# Patient Record
Sex: Female | Born: 1990 | Race: Black or African American | Hispanic: Yes | Marital: Married | Smoking: Never smoker
Health system: Southern US, Community
[De-identification: ages and names within clinical notes are randomized; demographics above are authoritative.]

## PROBLEM LIST (undated history)

## (undated) DIAGNOSIS — E049 Nontoxic goiter, unspecified: Secondary | ICD-10-CM

## (undated) DIAGNOSIS — E559 Vitamin D deficiency, unspecified: Secondary | ICD-10-CM

## (undated) DIAGNOSIS — I10 Essential (primary) hypertension: Secondary | ICD-10-CM

## (undated) DIAGNOSIS — S82853A Displaced trimalleolar fracture of unspecified lower leg, initial encounter for closed fracture: Secondary | ICD-10-CM

## (undated) HISTORY — DX: Nontoxic goiter, unspecified: E04.9

## (undated) HISTORY — DX: Vitamin D deficiency, unspecified: E55.9

## (undated) HISTORY — PX: GASTRECTOMY: SHX58

## (undated) HISTORY — PX: CHOLECYSTECTOMY: SHX55

---

## 2017-11-26 ENCOUNTER — Ambulatory Visit (INDEPENDENT_AMBULATORY_CARE_PROVIDER_SITE_OTHER): Payer: Self-pay | Admitting: Orthopaedic Surgery

## 2017-11-26 ENCOUNTER — Encounter (HOSPITAL_BASED_OUTPATIENT_CLINIC_OR_DEPARTMENT_OTHER): Payer: Self-pay | Admitting: *Deleted

## 2017-11-26 ENCOUNTER — Other Ambulatory Visit: Payer: Self-pay

## 2017-11-26 ENCOUNTER — Ambulatory Visit (INDEPENDENT_AMBULATORY_CARE_PROVIDER_SITE_OTHER): Payer: Self-pay

## 2017-11-26 DIAGNOSIS — S82852A Displaced trimalleolar fracture of left lower leg, initial encounter for closed fracture: Secondary | ICD-10-CM

## 2017-11-26 NOTE — Progress Notes (Signed)
Spoke with Sherri at Dr. Warren DanesXu's office, explained pt could not be done here due to [facility] guidelines with her BMI 59.91.

## 2017-11-26 NOTE — Progress Notes (Signed)
Office Visit Note   Patient: Stacey Robinson           Date of Birth: Dec 07, 1990           MRN: [id number] Visit Date: 11/26/2017              Requested by: No referring provider defined for this encounter. PCP: Patient, No Pcp Per   Assessment & Plan: Visit Diagnoses:  1. Closed displaced trimalleolar fracture of left ankle, initial encounter     Plan: Impression is unstable trimalleolar ankle fracture with Weber C lateral malleolus.  X-rays were reviewed with the patient and recommendation is for surgical fixation.  We discussed the risks and benefits and expected postoperative recovery.  She understands wished to proceed.  We will need to wait a week to let the swelling go down first.  For the meantime we will put her in a short leg splint.  She is to keep it elevated at all times and to ice at all times.  Follow-Up Instructions: Return for 2 week postop visit.   Orders:  Orders Placed This Encounter  Procedures  . XR Ankle Complete Left   No orders of the defined types were placed in this encounter.     Procedures: No procedures performed   Clinical Data: No additional findings.   Subjective: Chief Complaint  Patient presents with  . Left Ankle - Pain, Injury    Larey SeatFell 11/24/17    Stacey Robinson is a 27 year old healthy female who comes in with acute injury to her left ankle that she sustained 2 days ago while she was at her son's track meet.  She stepped awkwardly and fell and twisted her ankle underneath her body.  She denies any numbness tingling.  She was evaluated initially at the ED down in [place].   Review of Systems  Constitutional: Negative.   HENT: Negative.   Eyes: Negative.   Respiratory: Negative.   Cardiovascular: Negative.   Endocrine: Negative.   Musculoskeletal: Negative.   Neurological: Negative.   Hematological: Negative.   Psychiatric/Behavioral: Negative.   All other systems reviewed and are negative.    Objective: Vital Signs:  There were no vitals taken for this visit.  Physical Exam  Constitutional: She is oriented to person, place, and time. She appears well-developed and well-nourished.  HENT:  Head: Normocephalic and atraumatic.  Eyes: EOM are normal.  Neck: Neck supple.  Pulmonary/Chest: Effort normal.  Abdominal: Soft.  Neurological: She is alert and oriented to person, place, and time.  Skin: Skin is warm. Capillary refill takes less than 2 seconds.  Psychiatric: She has a normal mood and affect. Her behavior is normal. Judgment and thought content normal.  Nursing note and vitals reviewed.   Ortho Exam Left ankle exam shows severe swelling without any fracture blisters.  No neurovascular compromise. Specialty Comments:  No specialty comments available.  Imaging: Xr Ankle Complete Left  Result Date: 11/26/2017 Displaced Weber C lateral malleolus fracture with widening of the medial clear space and a small nondisplaced posterior malleolus fracture.    PMFS History: There are no active problems to display for this patient.  No past medical history on file.  No family history on file.   Social History   Occupational History  . Not on file  Tobacco Use  . Smoking status: Not on file  Substance and Sexual Activity  . Alcohol use: Not on file  . Drug use: Not on file  . Sexual activity: Not on file

## 2017-11-27 NOTE — Pre-Procedure Instructions (Signed)
Stacey Robinson  11/27/2017      [facility] [phone] [place] - [address] [facility] [address] [facility] [address] [place] [id number] Phone: [phone] Fax: [phone]    Your procedure is scheduled on Mon., December 02, 2017 from 4:00PM-5:38PM  [facility]" at 2:00PM  Call this number if you have problems the morning of surgery:  [phone]   Remember:  Do not eat or drink after midnight on June 23rd    Take these medicines the morning of surgery with A SIP OF WATER: If needed HYDROcodone-acetaminophen (NORCO/VICODIN)  As of today, stop taking all Other Aspirin Products, Vitamins, Fish oils, and Herbal medications. Also stop all NSAIDS i.e. Advil, Ibuprofen, Motrin, Aleve, Anaprox, Naproxen, BC, Goody Powders, and all Supplements.    Do not wear jewelry, make-up or nail polish.  Do not wear lotions, powders, or perfumes, or deodorant.  Do not shave 48 hours prior to surgery.    Do not bring valuables to the hospital.  [facility] is not responsible for any belongings or valuables.  Contacts, dentures or bridgework may not be worn into surgery.  Leave your suitcase in the car.  After surgery it may be brought to your room.  For patients admitted to the hospital, discharge time will be determined by your treatment team.  Patients discharged the day of surgery will not be allowed to drive home.   Special instructions:   [place]- Preparing For Surgery  Before surgery, you can play an important role. Because skin is not sterile, your skin needs to be as free of germs as possible. You can reduce the number of germs on your skin by washing with CHG (chlorahexidine gluconate) Soap before surgery.  CHG is an antiseptic cleaner which kills germs and bonds with the skin to continue killing germs even after washing.    Oral Hygiene is also important to reduce your risk of infection.  Remember - BRUSH  YOUR TEETH THE MORNING OF SURGERY WITH YOUR REGULAR TOOTHPASTE  Please do not use if you have an allergy to CHG or antibacterial soaps. If your skin becomes reddened/irritated stop using the CHG.  Do not shave (including legs and underarms) for at least 48 hours prior to first CHG shower. It is OK to shave your face.  Please follow these instructions carefully.   1. Shower the NIGHT BEFORE SURGERY and the MORNING OF SURGERY with CHG.   2. If you chose to wash your hair, wash your hair first as usual with your normal shampoo.  3. After you shampoo, rinse your hair and body thoroughly to remove the shampoo.  4. Use CHG as you would any other liquid soap. You can apply CHG directly to the skin and wash gently with a scrungie or a clean washcloth.   5. Apply the CHG Soap to your body ONLY FROM THE NECK DOWN.  Do not use on open wounds or open sores. Avoid contact with your eyes, ears, mouth and genitals (private parts). Wash Face and genitals (private parts)  with your normal soap.  6. Wash thoroughly, paying special attention to the area where your surgery will be performed.  7. Thoroughly rinse your body with warm water from the neck down.  8. DO NOT shower/wash with your normal soap after using and rinsing off the CHG Soap.  9. Pat yourself dry with a CLEAN TOWEL.  10.  Wear CLEAN PAJAMAS to bed the night before surgery, wear comfortable clothes the morning of surgery  11. Place CLEAN SHEETS on your bed the night of your first shower and DO NOT SLEEP WITH PETS.  Day of Surgery:  Do not apply any deodorants/lotions.  Please wear clean clothes to the hospital/surgery center.   Remember to brush your teeth WITH YOUR REGULAR TOOTHPASTE.  Please read over the following fact sheets that you were given. Pain Booklet, Coughing and Deep Breathing, MRSA Information and Surgical Site Infection Prevention

## 2017-11-28 ENCOUNTER — Telehealth (INDEPENDENT_AMBULATORY_CARE_PROVIDER_SITE_OTHER): Payer: Self-pay

## 2017-11-28 ENCOUNTER — Other Ambulatory Visit (INDEPENDENT_AMBULATORY_CARE_PROVIDER_SITE_OTHER): Payer: Self-pay | Admitting: Physician Assistant

## 2017-11-28 ENCOUNTER — Other Ambulatory Visit: Payer: Self-pay

## 2017-11-28 ENCOUNTER — Encounter (HOSPITAL_COMMUNITY)
Admission: RE | Admit: 2017-11-28 | Discharge: 2017-11-28 | Disposition: A | Discharge status: Home / Self Care | Payer: Self-pay | Source: Ambulatory Visit | Attending: Orthopaedic Surgery | Admitting: Orthopaedic Surgery

## 2017-11-28 ENCOUNTER — Encounter (HOSPITAL_COMMUNITY): Payer: Self-pay

## 2017-11-28 DIAGNOSIS — Z01812 Encounter for preprocedural laboratory examination: Secondary | ICD-10-CM | POA: Insufficient documentation

## 2017-11-28 HISTORY — DX: Displaced trimalleolar fracture of unspecified lower leg, initial encounter for closed fracture: S82.853A

## 2017-11-28 LAB — TYPE AND SCREEN
ABO/RH(D): O POS
Antibody Screen: NEGATIVE

## 2017-11-28 LAB — ABO/RH: ABO/RH(D): O POS

## 2017-11-28 LAB — BASIC METABOLIC PANEL
ANION GAP: 7 (ref 5–15)
BUN: 7 mg/dL (ref 6–20)
CHLORIDE: 109 mmol/L (ref 101–111)
CO2: 25 mmol/L (ref 22–32)
Calcium: 8.9 mg/dL (ref 8.9–10.3)
Creatinine, Ser: 0.7 mg/dL (ref 0.44–1.00)
Glucose, Bld: 88 mg/dL (ref 65–99)
POTASSIUM: 3.2 mmol/L — AB (ref 3.5–5.1)
SODIUM: 141 mmol/L (ref 135–145)

## 2017-11-28 LAB — CBC
HEMATOCRIT: 42.4 % (ref 36.0–46.0)
Hemoglobin: 13.3 g/dL (ref 12.0–15.0)
MCH: 29 pg (ref 26.0–34.0)
MCHC: 31.4 g/dL (ref 30.0–36.0)
MCV: 92.4 fL (ref 78.0–100.0)
Platelets: 265 10*3/uL (ref 150–400)
RBC: 4.59 MIL/uL (ref 3.87–5.11)
RDW: 13.5 % (ref 11.5–15.5)
WBC: 7.5 10*3/uL (ref 4.0–10.5)

## 2017-11-28 LAB — SURGICAL PCR SCREEN
MRSA, PCR: NEGATIVE
STAPHYLOCOCCUS AUREUS: POSITIVE — AB

## 2017-11-28 LAB — HCG, SERUM, QUALITATIVE: Preg, Serum: NEGATIVE

## 2017-11-28 MED ORDER — POTASSIUM CHLORIDE CRYS ER 10 MEQ PO TBCR: 30.0000 meq | EXTENDED_RELEASE_TABLET | Freq: Two times a day (BID) | ORAL | 0 refills | Status: DC

## 2017-11-28 NOTE — Progress Notes (Signed)
Left vm to return my call.  If you talk to her, you can tell her that her potassium is 3.2 which is low.  Normal is 3.5-5.1

## 2017-11-28 NOTE — Progress Notes (Signed)
PCP - Denies  Cardiologist - Denies  Chest x-ray - Denies  EKG - Denies  Stress Test - Denies  ECHO - Denies  Cardiac Cath - Denies  Sleep Study - Denies CPAP - None  LABS- 11/28/17: CBC, BMP, Preg, T/S  ASA- Denies   Anesthesia- No  Pt denies having chest pain, sob, or fever at this time. All instructions explained to the pt, with a verbal understanding of the material. Pt agrees to go over the instructions while at home for a better understanding. The opportunity to ask questions was provided.

## 2017-11-28 NOTE — Progress Notes (Signed)
Will you call patient and let her know that her potassium is low and that I have called in some medicine to take for this?

## 2017-11-28 NOTE — Telephone Encounter (Signed)
error 

## 2017-12-02 ENCOUNTER — Encounter (HOSPITAL_COMMUNITY): Payer: Self-pay

## 2017-12-02 ENCOUNTER — Ambulatory Visit (HOSPITAL_COMMUNITY)
Admission: RE | Admit: 2017-12-02 | Discharge: 2017-12-02 | Disposition: A | Discharge status: Home / Self Care | Payer: Self-pay | Source: Ambulatory Visit | Attending: Orthopaedic Surgery | Admitting: Orthopaedic Surgery

## 2017-12-02 ENCOUNTER — Ambulatory Visit (HOSPITAL_COMMUNITY): Payer: Self-pay

## 2017-12-02 ENCOUNTER — Ambulatory Visit (HOSPITAL_COMMUNITY): Payer: Self-pay | Admitting: Certified Registered"

## 2017-12-02 ENCOUNTER — Encounter (HOSPITAL_COMMUNITY)
Admission: RE | Disposition: A | Discharge status: Home / Self Care | Payer: Self-pay | Source: Ambulatory Visit | Attending: Orthopaedic Surgery

## 2017-12-02 DIAGNOSIS — X58XXXA Exposure to other specified factors, initial encounter: Secondary | ICD-10-CM | POA: Insufficient documentation

## 2017-12-02 DIAGNOSIS — Z880 Allergy status to penicillin: Secondary | ICD-10-CM | POA: Insufficient documentation

## 2017-12-02 DIAGNOSIS — Z6841 Body Mass Index (BMI) 40.0 and over, adult: Secondary | ICD-10-CM | POA: Insufficient documentation

## 2017-12-02 DIAGNOSIS — S82852A Displaced trimalleolar fracture of left lower leg, initial encounter for closed fracture: Secondary | ICD-10-CM

## 2017-12-02 DIAGNOSIS — S93432A Sprain of tibiofibular ligament of left ankle, initial encounter: Secondary | ICD-10-CM | POA: Insufficient documentation

## 2017-12-02 DIAGNOSIS — Z79899 Other long term (current) drug therapy: Secondary | ICD-10-CM | POA: Insufficient documentation

## 2017-12-02 DIAGNOSIS — Z539 Procedure and treatment not carried out, unspecified reason: Secondary | ICD-10-CM

## 2017-12-02 HISTORY — PX: ORIF ANKLE FRACTURE: SHX5408

## 2017-12-02 SURGERY — OPEN REDUCTION INTERNAL FIXATION (ORIF) ANKLE FRACTURE
Anesthesia: General | Site: Ankle | Laterality: Left

## 2017-12-02 MED ORDER — FENTANYL CITRATE (PF) 250 MCG/5ML IJ SOLN
INTRAMUSCULAR | Status: AC
  Filled 2017-12-02: qty 5

## 2017-12-02 MED ORDER — MIDAZOLAM HCL 2 MG/2ML IJ SOLN
2.0000 mg | Freq: Once | INTRAMUSCULAR | Status: AC
  Administered 2017-12-02: 2 mg via INTRAVENOUS

## 2017-12-02 MED ORDER — ACETAMINOPHEN 10 MG/ML IV SOLN
INTRAVENOUS | Status: DC | PRN
  Administered 2017-12-02: 1000 mg via INTRAVENOUS

## 2017-12-02 MED ORDER — LIDOCAINE 2% (20 MG/ML) 5 ML SYRINGE
INTRAMUSCULAR | Status: DC | PRN
  Administered 2017-12-02: 60 mg via INTRAVENOUS

## 2017-12-02 MED ORDER — ROPIVACAINE HCL 7.5 MG/ML IJ SOLN
INTRAMUSCULAR | Status: DC | PRN
  Administered 2017-12-02: 30 mL via PERINEURAL

## 2017-12-02 MED ORDER — DEXMEDETOMIDINE HCL IN NACL 200 MCG/50ML IV SOLN
INTRAVENOUS | Status: DC | PRN
  Administered 2017-12-02: 4 ug via INTRAVENOUS
  Administered 2017-12-02: 8 ug via INTRAVENOUS
  Administered 2017-12-02: 4 ug via INTRAVENOUS
  Administered 2017-12-02: 12 ug via INTRAVENOUS
  Administered 2017-12-02: 4 ug via INTRAVENOUS

## 2017-12-02 MED ORDER — LIDOCAINE 2% (20 MG/ML) 5 ML SYRINGE
INTRAMUSCULAR | Status: AC
  Filled 2017-12-02: qty 5

## 2017-12-02 MED ORDER — FENTANYL CITRATE (PF) 100 MCG/2ML IJ SOLN
INTRAMUSCULAR | Status: AC
  Filled 2017-12-02: qty 2

## 2017-12-02 MED ORDER — DEXAMETHASONE SODIUM PHOSPHATE 10 MG/ML IJ SOLN
INTRAMUSCULAR | Status: AC
  Filled 2017-12-02: qty 1

## 2017-12-02 MED ORDER — CHLORHEXIDINE GLUCONATE 4 % EX LIQD: 60.0000 mL | Freq: Once | CUTANEOUS | Status: DC

## 2017-12-02 MED ORDER — OXYCODONE HCL 5 MG PO TABS
ORAL_TABLET | ORAL | Status: AC
  Filled 2017-12-02: qty 1

## 2017-12-02 MED ORDER — BUPIVACAINE-EPINEPHRINE (PF) 0.5% -1:200000 IJ SOLN
INTRAMUSCULAR | Status: DC | PRN
  Administered 2017-12-02: 30 mL via PERINEURAL
  Administered 2017-12-02: 25 mL via PERINEURAL

## 2017-12-02 MED ORDER — VANCOMYCIN HCL 1000 MG IV SOLR
INTRAVENOUS | Status: AC
  Filled 2017-12-02: qty 1000

## 2017-12-02 MED ORDER — OXYCODONE HCL 5 MG/5ML PO SOLN: 5.0000 mg | Freq: Once | ORAL | Status: AC | PRN

## 2017-12-02 MED ORDER — KETOROLAC TROMETHAMINE 30 MG/ML IJ SOLN
INTRAMUSCULAR | Status: DC | PRN
  Administered 2017-12-02: 30 mg via INTRAVENOUS

## 2017-12-02 MED ORDER — OXYCODONE HCL 5 MG PO TABS
5.0000 mg | ORAL_TABLET | Freq: Once | ORAL | Status: AC
Start: 2017-12-02 — End: 2017-12-02
  Administered 2017-12-02: 5 mg via ORAL

## 2017-12-02 MED ORDER — ASPIRIN EC 81 MG PO TBEC: 81.0000 mg | DELAYED_RELEASE_TABLET | Freq: Two times a day (BID) | ORAL | 0 refills | Status: DC

## 2017-12-02 MED ORDER — ONDANSETRON HCL 4 MG/2ML IJ SOLN
INTRAMUSCULAR | Status: AC
  Filled 2017-12-02: qty 2

## 2017-12-02 MED ORDER — SODIUM CHLORIDE 0.9 % IR SOLN
Status: DC | PRN
  Administered 2017-12-02: 3000 mL

## 2017-12-02 MED ORDER — ROCURONIUM BROMIDE 10 MG/ML (PF) SYRINGE
PREFILLED_SYRINGE | INTRAVENOUS | Status: DC | PRN
  Administered 2017-12-02: 50 mg via INTRAVENOUS

## 2017-12-02 MED ORDER — VANCOMYCIN HCL 1000 MG IV SOLR
INTRAVENOUS | Status: DC | PRN
  Administered 2017-12-02: 1000 mg

## 2017-12-02 MED ORDER — CLINDAMYCIN PHOSPHATE 900 MG/50ML IV SOLN
INTRAVENOUS | Status: AC
  Filled 2017-12-02: qty 50

## 2017-12-02 MED ORDER — BUPIVACAINE-EPINEPHRINE (PF) 0.25% -1:200000 IJ SOLN
INTRAMUSCULAR | Status: AC
  Filled 2017-12-02: qty 30

## 2017-12-02 MED ORDER — SENNOSIDES-DOCUSATE SODIUM 8.6-50 MG PO TABS: 1.0000 | ORAL_TABLET | Freq: Every evening | ORAL | 1 refills | Status: DC | PRN

## 2017-12-02 MED ORDER — PROPOFOL 10 MG/ML IV BOLUS
INTRAVENOUS | Status: DC | PRN
  Administered 2017-12-02: 250 mg via INTRAVENOUS
  Administered 2017-12-02: 30 mg via INTRAVENOUS
  Administered 2017-12-02: 50 mg via INTRAVENOUS

## 2017-12-02 MED ORDER — OXYCODONE HCL 5 MG PO TABS
5.0000 mg | ORAL_TABLET | Freq: Once | ORAL | Status: AC | PRN
  Administered 2017-12-02: 5 mg via ORAL

## 2017-12-02 MED ORDER — PROPOFOL 10 MG/ML IV BOLUS
INTRAVENOUS | Status: AC
  Filled 2017-12-02: qty 40

## 2017-12-02 MED ORDER — FENTANYL CITRATE (PF) 100 MCG/2ML IJ SOLN
25.0000 ug | INTRAMUSCULAR | Status: DC | PRN
  Administered 2017-12-02: 50 ug via INTRAVENOUS
  Administered 2017-12-02: 25 ug via INTRAVENOUS

## 2017-12-02 MED ORDER — OXYCODONE HCL ER 10 MG PO T12A: 10.0000 mg | EXTENDED_RELEASE_TABLET | Freq: Two times a day (BID) | ORAL | 0 refills | Status: AC

## 2017-12-02 MED ORDER — BUPIVACAINE-EPINEPHRINE 0.25% -1:200000 IJ SOLN
INTRAMUSCULAR | Status: DC | PRN
  Administered 2017-12-02: 30 mL

## 2017-12-02 MED ORDER — MIDAZOLAM HCL 2 MG/2ML IJ SOLN
INTRAMUSCULAR | Status: AC
  Filled 2017-12-02: qty 2

## 2017-12-02 MED ORDER — PROMETHAZINE HCL 25 MG PO TABS: 25.0000 mg | ORAL_TABLET | Freq: Four times a day (QID) | ORAL | 1 refills | Status: DC | PRN

## 2017-12-02 MED ORDER — METHOCARBAMOL 750 MG PO TABS: 750.0000 mg | ORAL_TABLET | Freq: Two times a day (BID) | ORAL | 0 refills | Status: DC | PRN

## 2017-12-02 MED ORDER — ACETAMINOPHEN 10 MG/ML IV SOLN
INTRAVENOUS | Status: AC
  Filled 2017-12-02: qty 100

## 2017-12-02 MED ORDER — OXYCODONE-ACETAMINOPHEN 5-325 MG PO TABS: 1.0000 | ORAL_TABLET | ORAL | 0 refills | Status: DC | PRN

## 2017-12-02 MED ORDER — LACTATED RINGERS IV SOLN
INTRAVENOUS | Status: DC
  Administered 2017-12-02 (×2): via INTRAVENOUS

## 2017-12-02 MED ORDER — BUPIVACAINE HCL (PF) 0.25 % IJ SOLN
INTRAMUSCULAR | Status: AC
  Filled 2017-12-02: qty 30

## 2017-12-02 MED ORDER — ZINC SULFATE 220 (50 ZN) MG PO CAPS: 220.0000 mg | ORAL_CAPSULE | Freq: Every day | ORAL | 0 refills | Status: DC

## 2017-12-02 MED ORDER — DEXAMETHASONE SODIUM PHOSPHATE 10 MG/ML IJ SOLN
INTRAMUSCULAR | Status: DC | PRN
  Administered 2017-12-02: 10 mg via INTRAVENOUS

## 2017-12-02 MED ORDER — SUGAMMADEX SODIUM 500 MG/5ML IV SOLN
INTRAVENOUS | Status: AC
  Filled 2017-12-02: qty 5

## 2017-12-02 MED ORDER — PROMETHAZINE HCL 25 MG/ML IJ SOLN: 6.2500 mg | INTRAMUSCULAR | Status: DC | PRN

## 2017-12-02 MED ORDER — FENTANYL CITRATE (PF) 250 MCG/5ML IJ SOLN
INTRAMUSCULAR | Status: DC | PRN
  Administered 2017-12-02: 50 ug via INTRAVENOUS
  Administered 2017-12-02 (×2): 100 ug via INTRAVENOUS
  Administered 2017-12-02 (×2): 50 ug via INTRAVENOUS
  Administered 2017-12-02: 100 ug via INTRAVENOUS
  Administered 2017-12-02 (×2): 50 ug via INTRAVENOUS
  Administered 2017-12-02: 100 ug via INTRAVENOUS

## 2017-12-02 MED ORDER — ONDANSETRON HCL 4 MG PO TABS: 4.0000 mg | ORAL_TABLET | Freq: Three times a day (TID) | ORAL | 0 refills | Status: DC | PRN

## 2017-12-02 MED ORDER — FENTANYL CITRATE (PF) 100 MCG/2ML IJ SOLN
100.0000 ug | Freq: Once | INTRAMUSCULAR | Status: AC
  Administered 2017-12-02: 100 ug via INTRAVENOUS

## 2017-12-02 MED ORDER — SUGAMMADEX SODIUM 500 MG/5ML IV SOLN
INTRAVENOUS | Status: DC | PRN
  Administered 2017-12-02: 330 mg via INTRAVENOUS

## 2017-12-02 MED ORDER — CALCIUM CARBONATE-VITAMIN D 500-200 MG-UNIT PO TABS: 1.0000 | ORAL_TABLET | Freq: Three times a day (TID) | ORAL | 12 refills | Status: DC

## 2017-12-02 MED ORDER — CLINDAMYCIN PHOSPHATE 900 MG/50ML IV SOLN
900.0000 mg | INTRAVENOUS | Status: AC
  Administered 2017-12-02: 900 mg via INTRAVENOUS

## 2017-12-02 MED ORDER — MIDAZOLAM HCL 5 MG/5ML IJ SOLN
INTRAMUSCULAR | Status: DC | PRN
  Administered 2017-12-02: 2 mg via INTRAVENOUS

## 2017-12-02 SURGICAL SUPPLY — 61 items
BANDAGE ACE 4X5 VEL STRL LF (GAUZE/BANDAGES/DRESSINGS) IMPLANT
BANDAGE ACE 6X5 VEL STRL LF (GAUZE/BANDAGES/DRESSINGS) ×3 IMPLANT
BANDAGE ELASTIC 6 VELCRO ST LF (GAUZE/BANDAGES/DRESSINGS) ×3 IMPLANT
BANDAGE ESMARK 6X9 LF (GAUZE/BANDAGES/DRESSINGS) ×1 IMPLANT
BIT DRILL QC 2.5MM SHRT EVO SM (DRILL) ×1 IMPLANT
BLADE SURG 15 STRL LF DISP TIS (BLADE) ×1 IMPLANT
BLADE SURG 15 STRL SS (BLADE) ×2
BNDG COHESIVE 4X5 TAN STRL (GAUZE/BANDAGES/DRESSINGS) ×3 IMPLANT
BNDG ESMARK 6X9 LF (GAUZE/BANDAGES/DRESSINGS) ×3
CANISTER SUCT 3000ML PPV (MISCELLANEOUS) ×3 IMPLANT
COVER SURGICAL LIGHT HANDLE (MISCELLANEOUS) ×3 IMPLANT
CUFF TOURNIQUET SINGLE 34IN LL (TOURNIQUET CUFF) IMPLANT
CUFF TOURNIQUET SINGLE 44IN (TOURNIQUET CUFF) ×3 IMPLANT
DRAPE C-ARM 42X72 X-RAY (DRAPES) ×3 IMPLANT
DRAPE C-ARMOR (DRAPES) ×3 IMPLANT
DRAPE INCISE IOBAN 66X45 STRL (DRAPES) ×3 IMPLANT
DRAPE U-SHAPE 47X51 STRL (DRAPES) ×3 IMPLANT
DRILL QC 2.5MM SHORT EVOS SM (DRILL) ×3
DURAPREP 26ML APPLICATOR (WOUND CARE) ×3 IMPLANT
ELECT CAUTERY BLADE 6.4 (BLADE) ×3 IMPLANT
ELECT REM PT RETURN 9FT ADLT (ELECTROSURGICAL) ×3
ELECTRODE REM PT RTRN 9FT ADLT (ELECTROSURGICAL) ×1 IMPLANT
GAUZE SPONGE 4X4 12PLY STRL (GAUZE/BANDAGES/DRESSINGS) ×3 IMPLANT
GAUZE SPONGE 4X4 12PLY STRL LF (GAUZE/BANDAGES/DRESSINGS) ×6 IMPLANT
GAUZE XEROFORM 5X9 LF (GAUZE/BANDAGES/DRESSINGS) ×3 IMPLANT
GLOVE BIOGEL PI IND STRL 7.0 (GLOVE) ×1 IMPLANT
GLOVE BIOGEL PI INDICATOR 7.0 (GLOVE) ×2
GLOVE ECLIPSE 7.0 STRL STRAW (GLOVE) ×3 IMPLANT
GLOVE SKINSENSE NS SZ7.5 (GLOVE) ×2
GLOVE SKINSENSE STRL SZ7.5 (GLOVE) ×1 IMPLANT
GLOVE SURG SYN 7.5  E (GLOVE) ×4
GLOVE SURG SYN 7.5 E (GLOVE) ×2 IMPLANT
GOWN STRL REIN XL XLG (GOWN DISPOSABLE) ×3 IMPLANT
KIT BASIN OR (CUSTOM PROCEDURE TRAY) ×3 IMPLANT
KIT INVISIKNOT ANKLE FRACTURE (Screw) ×6 IMPLANT
KIT TURNOVER KIT B (KITS) ×3 IMPLANT
NEEDLE HYPO 25GX1X1/2 BEV (NEEDLE) IMPLANT
NS IRRIG 1000ML POUR BTL (IV SOLUTION) ×3 IMPLANT
PACK ORTHO EXTREMITY (CUSTOM PROCEDURE TRAY) ×3 IMPLANT
PAD ABD 8X10 STRL (GAUZE/BANDAGES/DRESSINGS) ×9 IMPLANT
PAD ARMBOARD 7.5X6 YLW CONV (MISCELLANEOUS) ×6 IMPLANT
PAD CAST 4YDX4 CTTN HI CHSV (CAST SUPPLIES) ×2 IMPLANT
PADDING CAST COTTON 4X4 STRL (CAST SUPPLIES) ×4
PADDING CAST COTTON 6X4 STRL (CAST SUPPLIES) ×18 IMPLANT
PLATE LOCK EVOS 3.5X94 8H (Plate) ×3 IMPLANT
SCREW CORT 3.5X14 ST EVOS (Screw) ×3 IMPLANT
SCREW CORT EVOS ST 3.5X12 (Screw) ×18 IMPLANT
SPLINT FIBERGLASS 4X30 (CAST SUPPLIES) ×3 IMPLANT
SUCTION FRAZIER HANDLE 10FR (MISCELLANEOUS) ×2
SUCTION TUBE FRAZIER 10FR DISP (MISCELLANEOUS) ×1 IMPLANT
SUT ETHILON 3 0 PS 1 (SUTURE) ×18 IMPLANT
SUT VIC AB 0 CT1 27 (SUTURE) ×4
SUT VIC AB 0 CT1 27XBRD ANBCTR (SUTURE) ×2 IMPLANT
SUT VIC AB 2-0 CT1 27 (SUTURE) ×4
SUT VIC AB 2-0 CT1 TAPERPNT 27 (SUTURE) ×2 IMPLANT
SYR CONTROL 10ML LL (SYRINGE) IMPLANT
TOWEL OR 17X24 6PK STRL BLUE (TOWEL DISPOSABLE) ×3 IMPLANT
TOWEL OR 17X26 10 PK STRL BLUE (TOWEL DISPOSABLE) ×6 IMPLANT
TUBE CONNECTING 12'X1/4 (SUCTIONS) ×1
TUBE CONNECTING 12X1/4 (SUCTIONS) ×2 IMPLANT
UNDERPAD 30X30 (UNDERPADS AND DIAPERS) ×3 IMPLANT

## 2017-12-02 NOTE — Op Note (Addendum)
Date of Surgery: 12/02/2017  INDICATIONS: [name] is a 27 y.o.-year-old female who sustained a left ankle fracture; she was indicated for open reduction and internal fixation due to the displaced nature of the articular fracture and came to the operating room today for this procedure. The patient did consent to the procedure after discussion of the risks and benefits.  PREOPERATIVE DIAGNOSIS:  1. Left trimalleolar ankle fracture 2. Left ankle syndesmosis rupture  POSTOPERATIVE DIAGNOSIS: Same.  PROCEDURE:  1.  Open reduction internal fixation of left trimalleolar ankle fracture without fixation of posterior malleolus 2.  Open reduction tight rope fixation of syndesmosis rupture  SURGEON: N. Glee ArvinMichael Panayiotis [place], M.D.  ASSIST: Starlyn SkeansMary Lindsey Oak[place], [place]; necessary for the timely completion of procedure and due to complexity of procedure.  ANESTHESIA:  general  TOURNIQUET TIME: See anesthesia record  IV FLUIDS AND URINE: See anesthesia.  ESTIMATED BLOOD LOSS: 150 mL.  IMPLANTS: [employer] semitubular locking plate, [employer] tight rope  COMPLICATIONS: None.  DESCRIPTION OF PROCEDURE: The patient was brought to the operating room and placed supine on the operating table.  The patient had been signed prior to the procedure and this was documented. The patient had the anesthesia placed by the anesthesiologist.  A nonsterile tourniquet was placed on the upper thigh.  The prep verification and incision time-outs were performed to confirm that this was the correct patient, site, side and location. The patient had an SCD on the opposite lower extremity. The patient did receive antibiotics prior to the incision and was re-dosed during the procedure as needed at indicated intervals.  The patient had the lower extremity prepped and draped in the standard surgical fashion.  The extremity was exsanguinated using an esmarch bandage and the tourniquet was inflated to 300 mm Hg.  An incision  was made over the lateral aspect of the fibula centered over the level of the fracture.  Dissection was carried through the soft tissue.  Fascia was incised in line with the incision.  Subperiosteal elevation was performed.  The fracture was exposed.  Entrapped periosteum and organized hematoma were removed from the fracture site.  The fracture was then reduced and brought out to length and clamped.  This was confirmed under fluoroscopy.  Once this was done we still noticed that there was some asymmetry of the medial side of the ankle.  I made a separate incision over the medial aspect of the ankle.  Dissection was carried down to the medial malleolus.  There was no evidence of a fracture however the deltoid ligament was avulsed off of the medial malleolus.  This was also entrapped within the medial gutter.  I used my finger to sweep this out of the medial gutter in order to achieve a concentric reduction of ankle.  After this was done I then placed a semitubular plate on the lateral aspect of the fibula.  Nonlocking screws were placed through the proximal portion of the plate into the fibula each with excellent purchase.  I then placed a screw through the plate across the fracture and a leg fashion.  I then placed to tight rope across the syndesmosis parallel to the joint using fluoroscopic guidance with the ankle joint reduced.  Final x-rays were taken.  One gram of vancomycin powder was placed in the wounds.  The surgical wounds were thoroughly irrigated and closed in layered fashion using 0 Vicryl, 2-0 Vicryl, 3-0 nylon.  Sterile dressings were applied.  Foot was immobilized in a  short leg splint at 90 degrees.  Patient tolerated procedure well had no immediate complications.  POSTOPERATIVE PLAN: Stacey Robinson will remain nonweightbearing on this leg for approximately 6 weeks; Stacey Robinson will return for suture removal in 2 weeks.  He will be immobilized in a short leg splint and then transitioned to a CAM  walker at his first follow up appointment.  Stacey Robinson will receive DVT prophylaxis based on other medications, activity level, and risk ratio of bleeding to thrombosis.  Mayra Reel, MD [facility] [phone] 3:45 PM

## 2017-12-02 NOTE — Progress Notes (Signed)
Patient in a lot of pain even following pain med administration. Spoke with Dr. Maple HudsonMoser who ordered oxy IR 5 mg. MD came to see patient and did a nerve block. Patient comfortable and pain is relieved.

## 2017-12-02 NOTE — Anesthesia Preprocedure Evaluation (Addendum)
Anesthesia Evaluation  Patient identified by MRN, date of birth, ID band Patient awake    Reviewed: Allergy & Precautions, NPO status , Patient's Chart, lab work & pertinent test results  Airway Mallampati: II  TM Distance: >3 FB Neck ROM: Full    Dental  (+) Dental Advisory Given, Teeth Intact   Pulmonary neg pulmonary ROS,    breath sounds clear to auscultation       Cardiovascular negative cardio ROS   Rhythm:Regular Rate:Normal     Neuro/Psych negative neurological ROS  negative psych ROS   GI/Hepatic negative GI ROS, Neg liver ROS,   Endo/Other  Morbid obesity  Renal/GU negative Renal ROS  negative genitourinary   Musculoskeletal negative musculoskeletal ROS (+)   Abdominal (+) + obese,   Peds  Hematology negative hematology ROS (+)   Anesthesia Other Findings   Reproductive/Obstetrics                            Anesthesia Physical Anesthesia Plan  ASA: III  Anesthesia Plan: General   Post-op Pain Management:  Regional for Post-op pain   Induction: Intravenous  PONV Risk Score and Plan: 3 and Treatment may vary due to age or medical condition, Ondansetron, Midazolam and Dexamethasone  Airway Management Planned: Oral ETT  Additional Equipment: None  Intra-op Plan:   Post-operative Plan: Extubation in OR  Informed Consent: I have reviewed the patients History and Physical, chart, labs and discussed the procedure including the risks, benefits and alternatives for the proposed anesthesia with the patient or authorized representative who has indicated his/her understanding and acceptance.   Dental advisory given  Plan Discussed with: CRNA and Anesthesiologist  Anesthesia Plan Comments:         Anesthesia Quick Evaluation

## 2017-12-02 NOTE — Discharge Instructions (Signed)
° ° °  1. Keep splint clean and dry °2. Elevate foot above level of the heart °3. Take aspirin to prevent blood clots °4. Take pain meds as needed °5. Strict non weight bearing to operative extremity ° °

## 2017-12-02 NOTE — Anesthesia Procedure Notes (Signed)
Procedure Name: Intubation Date/Time: 12/02/2017 2:24 PM Performed by: Freddie Breech, CRNA Pre-anesthesia Checklist: Patient identified, Emergency Drugs available, Suction available and Patient being monitored Patient Re-evaluated:Patient Re-evaluated prior to induction Oxygen Delivery Method: Circle System Utilized Preoxygenation: Pre-oxygenation with 100% oxygen Induction Type: IV induction Ventilation: Mask ventilation without difficulty Laryngoscope Size: Mac and 4 Grade View: Grade II Tube type: Oral Tube size: 7.5 mm Number of attempts: 1 Airway Equipment and Method: Stylet and Oral airway Placement Confirmation: ETT inserted through vocal cords under direct vision,  positive ETCO2 and breath sounds checked- equal and bilateral Secured at: 23 cm Tube secured with: Tape Dental Injury: Teeth and Oropharynx as per pre-operative assessment

## 2017-12-02 NOTE — H&P (Signed)
PREOPERATIVE H&P  Chief Complaint: left trimalleolar ankle fracture  HPI: Stacey Robinson is a 27 y.o. female who presents for surgical treatment of left trimalleolar ankle fracture.  She denies any changes in medical history.  Past Medical History:  Diagnosis Date  . Trimalleolar fracture of ankle, closed    Left   Past Surgical History:  Procedure Laterality Date  . CESAREAN SECTION     x 3  . CHOLECYSTECTOMY     2011   Social History   Socioeconomic History  . Marital status: Married    Spouse name: Not on file  . Number of children: Not on file  . Years of education: Not on file  . Highest education level: Not on file  Occupational History  . Not on file  Social Needs  . Financial resource strain: Not on file  . Food insecurity:    Worry: Not on file    Inability: Not on file  . Transportation needs:    Medical: Not on file    Non-medical: Not on file  Tobacco Use  . Smoking status: Never Smoker  . Smokeless tobacco: Never Used  Substance and Sexual Activity  . Alcohol use: Never    Frequency: Never  . Drug use: Never  . Sexual activity: Yes    Birth control/protection: Implant    Comment: Expired implant in arm   Lifestyle  . Physical activity:    Days per week: Not on file    Minutes per session: Not on file  . Stress: Not on file  Relationships  . Social connections:    Talks on phone: Not on file    Gets together: Not on file    Attends religious service: Not on file    Active member of club or organization: Not on file    Attends meetings of clubs or organizations: Not on file    Relationship status: Not on file  Other Topics Concern  . Not on file  Social History Narrative  . Not on file   History reviewed. No pertinent family history. Allergies  Allergen Reactions  . Penicillins Hives    Has patient had a PCN reaction causing immediate rash, facial/tongue/throat swelling, SOB or lightheadedness with hypotension: No Has patient  had a PCN reaction causing severe rash involving mucus membranes or skin necrosis: No Has patient had a PCN reaction that required hospitalization: No Has patient had a PCN reaction occurring within the last 10 years: No If all of the above answers are "NO", then may proceed with Cephalosporin use.    Prior to Admission medications   Medication Sig Start Date End Date Taking? Authorizing Provider  etonogestrel (NEXPLANON) 68 MG IMPL implant 1 each by Subdermal route once.   Yes [provider]  HYDROcodone-acetaminophen (NORCO/VICODIN) 5-325 MG tablet Take 1 tablet by mouth every 4 (four) hours as needed for moderate pain. 11/24/17  Yes [provider]  potassium chloride (K-DUR,KLOR-CON) 10 MEQ tablet Take 3 tablets (30 mEq total) by mouth 2 (two) times daily. 11/28/17   Cristie HemStanbery, Mary L, PA-C     Positive ROS: All other systems have been reviewed and were otherwise negative with the exception of those mentioned in the HPI and as above.  Physical Exam: General: Alert, no acute distress Cardiovascular: No pedal edema Respiratory: No cyanosis, no use of accessory musculature GI: abdomen soft Skin: No lesions in the area of chief complaint Neurologic: Sensation intact distally Psychiatric: Patient is competent for consent with  normal mood and affect Lymphatic: no lymphedema  MUSCULOSKELETAL: exam stable  Assessment: left trimalleolar ankle fracture  Plan: Plan for Procedure(s): OPEN REDUCTION INTERNAL FIXATION (ORIF) LEFT TRIMALLEOLAR ANKLE FRACTURE  The risks benefits and alternatives were discussed with the patient including but not limited to the risks of nonoperative treatment, versus surgical intervention including infection, bleeding, nerve injury,  blood clots, cardiopulmonary complications, morbidity, mortality, among others, and they were willing to proceed.   Glee Arvin, MD   12/02/2017 7:11 AM

## 2017-12-02 NOTE — Anesthesia Procedure Notes (Signed)
Anesthesia Regional Block: Popliteal block   Pre-Anesthetic Checklist: ,, timeout performed, Correct Patient, Correct Site, Correct Laterality, Correct Procedure, Correct Position, site marked, Risks and benefits discussed,  Surgical consent,  Pre-op evaluation,  At surgeon's request and post-op pain management  Laterality: Lower and Left  Prep: chloraprep       Needles:  Injection technique: Single-shot  Needle Type: Echogenic Stimulator Needle          Additional Needles:   Procedures:,,,, ultrasound used (permanent image in chart),,,,  Narrative:  Start time: 12/02/2017 5:32 PM End time: 12/02/2017 5:39 PM Injection made incrementally with aspirations every 5 mL.  Performed by: Personally  Anesthesiologist: Val EagleMoser, Ido Wollman, MD  Additional Notes: H+P and labs reviewed, risks and benefits discussed with patient, procedure tolerated well without complications

## 2017-12-02 NOTE — Anesthesia Procedure Notes (Signed)
Anesthesia Regional Block: Adductor canal block   Pre-Anesthetic Checklist: ,, timeout performed, Correct Patient, Correct Site, Correct Laterality, Correct Procedure, Correct Position, site marked, Risks and benefits discussed,  Surgical consent,  Pre-op evaluation,  At surgeon's request and post-op pain management  Laterality: Left  Prep: chloraprep       Needles:  Injection technique: Single-shot  Needle Type: Echogenic Needle     Needle Length: 10cm  Needle Gauge: 21     Additional Needles:   Narrative:  Start time: 12/02/2017 1:32 PM End time: 12/02/2017 1:41 PM Injection made incrementally with aspirations every 5 mL.  Performed by: Personally  Anesthesiologist: Beryle LatheBrock, Thomas E, MD  Additional Notes: No pain on injection. No increased resistance to injection. Injection made in 5cc increments. Good needle visualization. Patient tolerated the procedure well.

## 2017-12-02 NOTE — Transfer of Care (Signed)
Immediate Anesthesia Transfer of Care Note  Patient: Stacey Robinson  Procedure(s) Performed: OPEN REDUCTION INTERNAL FIXATION (ORIF) LEFT TRIMALLEOLAR ANKLE FRACTURE (Left Ankle)  Patient Location: PACU  Anesthesia Type:General  Level of Consciousness: awake and patient cooperative  Airway & Oxygen Therapy: Patient Spontanous Breathing  Post-op Assessment: Report given to RN and Post -op Vital signs reviewed and stable  Post vital signs: Reviewed and stable  Last Vitals:  Vitals Value Taken Time  BP 147/81 12/02/2017  4:31 PM  Temp    Pulse 105 12/02/2017  4:33 PM  Resp 19 12/02/2017  4:33 PM  SpO2 99 % 12/02/2017  4:33 PM  Vitals shown include unvalidated device data.  Last Pain:  Vitals:   12/02/17 1223  TempSrc:   PainSc: 0-No pain         Complications: No apparent anesthesia complications

## 2017-12-02 NOTE — Anesthesia Postprocedure Evaluation (Signed)
Anesthesia Post Note  Patient: Stacey Robinson  Procedure(s) Performed: OPEN REDUCTION INTERNAL FIXATION (ORIF) LEFT TRIMALLEOLAR ANKLE FRACTURE (Left Ankle)     Patient location during evaluation: PACU Anesthesia Type: General and Regional Level of consciousness: awake and alert Pain management: pain level controlled Vital Signs Assessment: post-procedure vital signs reviewed and stable Respiratory status: spontaneous breathing, nonlabored ventilation, respiratory function stable and patient connected to nasal cannula oxygen Cardiovascular status: blood pressure returned to baseline and stable Postop Assessment: no apparent nausea or vomiting Anesthetic complications: no    Last Vitals:  Vitals:   12/02/17 1720 12/02/17 1745  BP:  134/89  Pulse: (!) 110 97  Resp: 16 20  Temp:    SpO2: 100% 100%    Last Pain:  Vitals:   12/02/17 1745  TempSrc:   PainSc: 0-No pain    LLE Motor Response: No movement due to regional block (12/02/17 1745) LLE Sensation: Decreased(patient just received nerve block) (12/02/17 1745)          Jaysha Lasure

## 2017-12-02 NOTE — Anesthesia Procedure Notes (Signed)
Anesthesia Regional Block: Popliteal block   Pre-Anesthetic Checklist: ,, timeout performed, Correct Patient, Correct Site, Correct Laterality, Correct Procedure, Correct Position, site marked, Risks and benefits discussed,  Surgical consent,  Pre-op evaluation,  At surgeon's request and post-op pain management  Laterality: Left  Prep: chloraprep       Needles:  Injection technique: Single-shot  Needle Type: Echogenic Needle     Needle Length: 10cm  Needle Gauge: 21     Additional Needles:   Narrative:  Start time: 12/02/2017 1:42 PM End time: 12/02/2017 1:49 PM Injection made incrementally with aspirations every 5 mL.  Performed by: Personally  Anesthesiologist: Beryle LatheBrock, Thomas E, MD  Additional Notes: No pain on injection. No increased resistance to injection. Injection made in 5cc increments. Good needle visualization. Patient tolerated the procedure well.

## 2017-12-04 ENCOUNTER — Encounter (HOSPITAL_COMMUNITY): Payer: Self-pay | Admitting: Orthopaedic Surgery

## 2017-12-17 ENCOUNTER — Encounter (INDEPENDENT_AMBULATORY_CARE_PROVIDER_SITE_OTHER): Payer: Self-pay | Admitting: Orthopaedic Surgery

## 2017-12-17 ENCOUNTER — Ambulatory Visit (INDEPENDENT_AMBULATORY_CARE_PROVIDER_SITE_OTHER): Payer: Self-pay

## 2017-12-17 ENCOUNTER — Ambulatory Visit (INDEPENDENT_AMBULATORY_CARE_PROVIDER_SITE_OTHER): Payer: Self-pay | Admitting: Orthopaedic Surgery

## 2017-12-17 DIAGNOSIS — S82852A Displaced trimalleolar fracture of left lower leg, initial encounter for closed fracture: Secondary | ICD-10-CM

## 2017-12-17 NOTE — Progress Notes (Signed)
   Post-Op Visit Note   Patient: Stacey Robinson           Date of Birth: January 09, 1991           MRN: [id number] Visit Date: 12/17/2017 PCP: Patient, No Pcp Per   Assessment & Plan:  Chief Complaint:  Chief Complaint  Patient presents with  . Left Ankle - Pain, Follow-up   Visit Diagnoses:  1. Closed displaced trimalleolar fracture of left ankle, initial encounter     Plan: Patient is two-week status post ORIF trimalleolar ankle fracture with syndesmosis rupture.  She is doing well.  Reports no pain.  She has normal expected postoperative swelling.  Surgical incision well-healed.  No neurovascular compromise.  Sutures were removed today.  We will continue with nonweightbearing.  Cam walker was fitted today.  Referral for home health physical therapy and home safety evaluation provided today.  Questions encouraged and answered.  Follow-up in 4 weeks with three-view x-rays of the left ankle.  Anticipate allowing her to advance to 25% partial weightbearing at that time.  Follow-Up Instructions: Return in about 1 month (around 01/14/2018).   Orders:  Orders Placed This Encounter  Procedures  . XR Ankle Complete Left   No orders of the defined types were placed in this encounter.   Imaging: Xr Ankle Complete Left  Result Date: 12/17/2017 Stable fixation of trimalleolar ankle fracture.  No complications.   PMFS History: There are no active problems to display for this patient.  Past Medical History:  Diagnosis Date  . Trimalleolar fracture of ankle, closed    Left    History reviewed. No pertinent family history.  Past Surgical History:  Procedure Laterality Date  . CESAREAN SECTION     x 3  . CHOLECYSTECTOMY     2011  . ORIF ANKLE FRACTURE Left 12/02/2017   Procedure: OPEN REDUCTION INTERNAL FIXATION (ORIF) LEFT TRIMALLEOLAR ANKLE FRACTURE;  Surgeon: Tarry KosXu, Rodman Recupero M, MD;  Location: [facility] OR;  Service: Orthopedics;  Laterality: Left;   Social History   Occupational History    . Not on file  Tobacco Use  . Smoking status: Never Smoker  . Smokeless tobacco: Never Used  Substance and Sexual Activity  . Alcohol use: Never    Frequency: Never  . Drug use: Never  . Sexual activity: Yes    Birth control/protection: Implant    Comment: Expired implant in arm

## 2018-01-17 ENCOUNTER — Ambulatory Visit (INDEPENDENT_AMBULATORY_CARE_PROVIDER_SITE_OTHER): Payer: Self-pay | Admitting: Physician Assistant

## 2018-01-17 ENCOUNTER — Encounter (INDEPENDENT_AMBULATORY_CARE_PROVIDER_SITE_OTHER): Payer: Self-pay | Admitting: Orthopaedic Surgery

## 2018-01-17 ENCOUNTER — Ambulatory Visit (INDEPENDENT_AMBULATORY_CARE_PROVIDER_SITE_OTHER): Payer: Self-pay

## 2018-01-17 DIAGNOSIS — S82852A Displaced trimalleolar fracture of left lower leg, initial encounter for closed fracture: Secondary | ICD-10-CM

## 2018-01-17 DIAGNOSIS — Z6841 Body Mass Index (BMI) 40.0 and over, adult: Secondary | ICD-10-CM

## 2018-01-17 DIAGNOSIS — E66812 Obesity, class 2: Secondary | ICD-10-CM | POA: Insufficient documentation

## 2018-01-17 HISTORY — DX: Displaced trimalleolar fracture of left lower leg, initial encounter for closed fracture: S82.852A

## 2018-01-17 NOTE — Progress Notes (Signed)
   Post-Op Visit Note   Patient: Stacey Robinson           Date of Birth: 22-Nov-1990           MRN: [id number] Visit Date: 01/17/2018 PCP: Patient, No Pcp Per   Assessment & Plan:  Chief Complaint:  Chief Complaint  Patient presents with  . Left Ankle - Pain   Visit Diagnoses:  1. Closed displaced trimalleolar fracture of left ankle, initial encounter   2. Morbid obesity (HCC)   3. Body mass index 50.0-59.9, adult [facility](HCC)     Plan: Patient is a pleasant 27 year old female who presents to our clinic today 46 days status post ORIF left trimalleolar ankle fracture with syndesmosis fixation, date of surgery 12/02/2017.  She has been compliant nonweightbearing in her cam walker.  She has been working on range of motion exercises on her own.  Elevating for swelling.  No fevers, chills or any other systemic symptoms.  Minimal pain.  Examination of the left ankle reveals well-healed surgical incisions without evidence of infection.  Very limited dorsiflexion.  Calf soft and nontender.  At this point, we will transition her to 25% weightbearing but must remain in her cam walker.  We will start her in formal physical therapy for passive range of motion as tolerated.  She will follow-up with us in 6 weeks time for repeat evaluation three-view x-ray of the left ankle.  Call if concerns or questions in the meantime. The patient meets the AMA guidelines for Morbid (severe) obesity with a BMI > 40.0 and I have recommended weight loss.   Follow-Up Instructions: Return in about 6 weeks (around 02/28/2018).   Orders:  Orders Placed This Encounter  Procedures  . XR Ankle Complete Left   No orders of the defined types were placed in this encounter.   Imaging: Xr Ankle Complete Left  Result Date: 01/17/2018 Stable fixation of the fracture with callus formation   PMFS History: Patient Active Problem List   Diagnosis Date Noted  . Closed displaced trimalleolar fracture of left lower leg 01/17/2018    . Morbid obesity (HCC) 01/17/2018  . Body mass index 50.0-59.9, adult (HCC) 01/17/2018   Past Medical History:  Diagnosis Date  . Trimalleolar fracture of ankle, closed    Left    History reviewed. No pertinent family history.  Past Surgical History:  Procedure Laterality Date  . CESAREAN SECTION     x 3  . CHOLECYSTECTOMY     2011  . ORIF ANKLE FRACTURE Left 12/02/2017   Procedure: OPEN REDUCTION INTERNAL FIXATION (ORIF) LEFT TRIMALLEOLAR ANKLE FRACTURE;  Surgeon: Tarry KosXu, Naiping M, MD;  Location: [facility] OR;  Service: Orthopedics;  Laterality: Left;   Social History   Occupational History  . Not on file  Tobacco Use  . Smoking status: Never Smoker  . Smokeless tobacco: Never Used  Substance and Sexual Activity  . Alcohol use: Never    Frequency: Never  . Drug use: Never  . Sexual activity: Yes    Birth control/protection: Implant    Comment: Expired implant in arm

## 2018-01-23 ENCOUNTER — Ambulatory Visit: Payer: Self-pay | Admitting: Physical Therapy

## 2018-01-28 ENCOUNTER — Other Ambulatory Visit (INDEPENDENT_AMBULATORY_CARE_PROVIDER_SITE_OTHER): Payer: Self-pay

## 2018-01-28 ENCOUNTER — Ambulatory Visit: Payer: Self-pay | Attending: Orthopaedic Surgery | Admitting: Physical Therapy

## 2018-01-28 ENCOUNTER — Encounter: Payer: Self-pay | Admitting: Physical Therapy

## 2018-01-28 ENCOUNTER — Telehealth (INDEPENDENT_AMBULATORY_CARE_PROVIDER_SITE_OTHER): Payer: Self-pay

## 2018-01-28 ENCOUNTER — Telehealth (INDEPENDENT_AMBULATORY_CARE_PROVIDER_SITE_OTHER): Payer: Self-pay | Admitting: Orthopaedic Surgery

## 2018-01-28 ENCOUNTER — Other Ambulatory Visit: Payer: Self-pay

## 2018-01-28 DIAGNOSIS — R262 Difficulty in walking, not elsewhere classified: Secondary | ICD-10-CM | POA: Insufficient documentation

## 2018-01-28 DIAGNOSIS — S82852A Displaced trimalleolar fracture of left lower leg, initial encounter for closed fracture: Secondary | ICD-10-CM

## 2018-01-28 DIAGNOSIS — M25572 Pain in left ankle and joints of left foot: Secondary | ICD-10-CM | POA: Insufficient documentation

## 2018-01-28 DIAGNOSIS — M25672 Stiffness of left ankle, not elsewhere classified: Secondary | ICD-10-CM | POA: Insufficient documentation

## 2018-01-28 NOTE — Therapy (Signed)
[facility] [address] [place], [place], [address] Phone: [phone]   Fax:  [phone]  Physical Therapy Evaluation  Patient Details  Name: Stacey Robinson MRN: [id number] Date of Birth: 05/02/1991 Referring Provider: Gershon Musselnaiping xu, MD   Encounter Date: 01/28/2018  PT End of Session - 01/28/18 1417    Visit Number  1    Number of Visits  21    Date for PT Re-Evaluation  04/11/18    Authorization Type  self pay    PT Start Time  1403    PT Stop Time  1449    PT Time Calculation (min)  46 min    Activity Tolerance  Patient tolerated treatment well    Behavior During Therapy  [facility] for tasks assessed/performed       Past Medical History:  Diagnosis Date  . Trimalleolar fracture of ankle, closed    Left    Past Surgical History:  Procedure Laterality Date  . CESAREAN SECTION     x 3  . CHOLECYSTECTOMY     2011  . ORIF ANKLE FRACTURE Left 12/02/2017   Procedure: OPEN REDUCTION INTERNAL FIXATION (ORIF) LEFT TRIMALLEOLAR ANKLE FRACTURE;  Surgeon: Tarry KosXu, Naiping M, MD;  Location: [facility] OR;  Service: Orthopedics;  Laterality: Left;    There were no vitals filed for this visit.   Subjective Assessment - 01/28/18 1417    Subjective  ORIF in June for trimalleolar fracture. Denies pain at this time. Bilat axillary crutches full time.     Patient Stated Goals  return to walking    Currently in Pain?  No/denies         [facility] - 01/28/18 0001      Assessment   Medical Diagnosis  trimalleolar fx, Lt s/p ORIF    Referring Provider  Gershon Musselnaiping xu, MD    Onset Date/Surgical Date  12/02/17    Hand Dominance  Right    Next MD Visit  9/24    Prior Therapy  no      Restrictions   Weight Bearing Restrictions  Yes    LLE Weight Bearing  Partial weight bearing    LLE Partial Weight Bearing Percentage or Pounds  25%      Balance Screen   Has the patient fallen in the past 6 months  Yes    How many times?  2    Has the  patient had a decrease in activity level because of a fear of falling?   Yes    Is the patient reluctant to leave their home because of a fear of falling?   No      Home Nurse, mental healthnvironment   Living Environment  Private residence    Living Arrangements  Spouse/significant other    Additional Comments  2 steps to enter home without hand rails      Prior Function   Level of Independence  Independent    Vocation Requirements  nurse      Cognition   Overall Cognitive Status  Within Functional Limits for tasks assessed      Observation/Other Assessments   Focus on Therapeutic Outcomes (FOTO)   61% limited      Observation/Other Assessments-Edema    Edema  --   mild edema noted at ankle     Sensation   Additional Comments  decreased senstation around incision      ROM / Strength   AROM / PROM / Strength  AROM;PROM  AROM   AROM Assessment Site  Ankle    Right/Left Ankle  Left    Left Ankle Dorsiflexion  -8    Left Ankle Plantar Flexion  40    Left Ankle Inversion  --   WFL   Left Ankle Eversion  --   WFL     PROM   PROM Assessment Site  Ankle    Right/Left Ankle  Left    Left Ankle Dorsiflexion  -5      Flexibility   Soft Tissue Assessment /Muscle Length  no      Palpation   Palpation comment  denies TTP      Ambulation/Gait   Assistive device  R Axillary Crutch;L Axillary Crutch    Gait Comments  not placing foot on ground                Objective measurements completed on examination: See above findings.      [facility] Adult PT Treatment/Exercise - 01/28/18 0001      Exercises   Exercises  Ankle      Ankle Exercises: Stretches   Other Stretch  DF stretch with towel slide      Ankle Exercises: Standing   Other Standing Ankle Exercises  25% WB    Other Standing Ankle Exercises  gait training with crutches      Ankle Exercises: Seated   Heel Raises Limitations  tolerated range    Toe Raise Limitations  tolerated range    Other Seated Ankle Exercises   toe yoga, towel scrunches             PT Education - 01/28/18 1521    Education Details  anatomy of condition, POC, HEP, exercise form/rationale, WB restrictions, shower chair, "up with the good, down with the bad    Person(s) Educated  Patient    Methods  Explanation;Demonstration;Tactile cues;Verbal cues;Handout    Comprehension  Verbalized understanding;Returned demonstration;Verbal cues required;Tactile cues required;Need further instruction       PT Short Term Goals - 01/28/18 2057      PT SHORT TERM GOAL #1   Title  Pt will demo proper gait pattern within weight bearing restrictions    Baseline  significant difficulty with pattern today    Time  4    Period  Weeks    Status  New    Target Date  02/25/18      PT SHORT TERM GOAL #2   Title  pt will be independent in use of RICE techniques to keep pain <=4/10 with daily activities    Time  4    Period  Weeks    Status  New    Target Date  02/25/18        PT Long Term Goals - 01/28/18 1541      PT LONG TERM GOAL #1   Title  FOTO to 37% limited    Baseline  61% limited at eval    Time  10    Period  Weeks    Status  New    Target Date  04/11/18      PT LONG TERM GOAL #2   Title  Pt will be able to ambulate for at least 1 hour pain <=2/10    Baseline  unable at eval    Time  10    Period  Weeks    Status  New    Target Date  04/11/18      PT LONG TERM GOAL #3  Title  DF ROM to +10 passively and +5 actively    Baseline  see flowsheet    Time  10    Period  Weeks    Status  New    Target Date  04/11/18      PT LONG TERM GOAL #4   Title  pt will be able to perform all house, self and child-care activities without limitaiton by ankle    Baseline  significantly limited at eval    Time  10    Period  Weeks    Status  New    Target Date  04/11/18      PT LONG TERM GOAL #5   Title  Pt will demo SLS control for at least 5 s    Baseline  unable at eval    Time  10    Period  Weeks    Status  New     Target Date  04/11/18             Plan - 01/28/18 1514    Clinical Impression Statement  Pt presents to PT s/p ankle ORIF for trimalleolar fracture on 12/02/2017. Pt is 25% weight bearing and using axillary crutches but has not placed weight through her foot before today. Pt with difficulty with new gait pattern and will continue to practice. Call placed to MD requesting clarification on weight bearing progression. DF ROM is limited with only mild discomfort in stretching. 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 Scabbing noted on medial incision site without signs of infection. Pt will benefit from skilled PT in order to improve functional strength, ROM and ambulation ability to reach long term functional goals.     History and Personal Factors relevant to plan of care:  obesity    Clinical Presentation  Stable    Clinical Decision Making  Low    Rehab Potential  Good    PT Frequency  2x / week    PT Duration  --   10 weeks   PT Treatment/Interventions  ADLs/Self Care Home Management;Cryotherapy;Electrical Stimulation;Therapeutic exercise;Therapeutic activities;Moist Heat;Functional mobility training;Stair training;Gait training;Balance training;Neuromuscular re-education;Patient/family education;Manual techniques;Vasopneumatic Device;Taping;Passive range of motion;Scar mobilization    PT Next Visit Plan  step training (no HR at home), gait training 25% WB status, MD clarification on WB progression    PT Home Exercise Plan  toe yoga, towel scrunches, seated heel/toe raises, passive DF stretch seated    Consulted and Agree with Plan of Care  Patient       Patient will benefit from skilled therapeutic intervention in order to improve the following deficits and impairments:  Abnormal gait, Increased edema, Decreased scar mobility, Decreased activity tolerance, Pain, Decreased strength, Difficulty walking, Decreased balance, Decreased mobility, Decreased range of motion, Improper body mechanics,  Postural dysfunction, Impaired flexibility  Visit Diagnosis: Pain in left ankle and joints of left foot - Plan: PT plan of care cert/re-cert  Stiffness of left ankle, not elsewhere classified - Plan: PT plan of care cert/re-cert  Difficulty in walking, not elsewhere classified - Plan: PT plan of care cert/re-cert     Problem List Patient Active Problem List   Diagnosis Date Noted  . Closed displaced trimalleolar fracture of left lower leg 01/17/2018  . Morbid obesity (HCC) 01/17/2018  . Body mass index 50.0-59.9, adult (HCC) 01/17/2018    Jimmi Sidener C. Matteson Blue PT, DPT 01/28/18 9:08 PM   [facility] [address] [place], [place], [address] Phone: [phone]  Fax:  [phone]  Name: Takeyla Million MRN: [id number] Date of Birth: 1990/11/18

## 2018-01-28 NOTE — Telephone Encounter (Signed)
[name] with [facility] would like to know if patient needs to wait for a F/U to increase her WB status or does she increase her WB?  Cb# is [phone].  Please advise.  Thank You.

## 2018-01-28 NOTE — Telephone Encounter (Signed)
Deanna ArtisKeisha asks that you put in a referral for physical therapy for this patient in the system since Dr. Roda ShuttersXu didn't sign the paper referral.   # [phone]

## 2018-01-28 NOTE — Telephone Encounter (Signed)
Please advise 

## 2018-01-28 NOTE — Telephone Encounter (Signed)
25% at this time

## 2018-01-28 NOTE — Telephone Encounter (Signed)
done

## 2018-01-28 NOTE — Telephone Encounter (Signed)
Called and left a message regarding this

## 2018-01-29 ENCOUNTER — Telehealth (INDEPENDENT_AMBULATORY_CARE_PROVIDER_SITE_OTHER): Payer: Self-pay

## 2018-01-29 NOTE — Telephone Encounter (Addendum)
Got message about pts wt bearing status being 25%. Wants to know if this is until her next follow up with Dr. Juliann ParesX or can she progress as tolerated

## 2018-01-29 NOTE — Telephone Encounter (Signed)
25% until next visit

## 2018-01-29 NOTE — Telephone Encounter (Signed)
done

## 2018-01-29 NOTE — Telephone Encounter (Signed)
PLEASE ADVISE.

## 2018-02-25 ENCOUNTER — Ambulatory Visit: Payer: Self-pay | Admitting: Physical Therapy

## 2018-02-27 ENCOUNTER — Ambulatory Visit: Payer: Self-pay | Admitting: Physical Therapy

## 2018-03-04 ENCOUNTER — Ambulatory Visit (INDEPENDENT_AMBULATORY_CARE_PROVIDER_SITE_OTHER): Payer: Self-pay | Admitting: Orthopaedic Surgery

## 2018-03-06 ENCOUNTER — Ambulatory Visit (INDEPENDENT_AMBULATORY_CARE_PROVIDER_SITE_OTHER): Payer: Self-pay | Admitting: Orthopaedic Surgery

## 2018-03-06 ENCOUNTER — Ambulatory Visit (INDEPENDENT_AMBULATORY_CARE_PROVIDER_SITE_OTHER): Payer: Self-pay

## 2018-03-06 ENCOUNTER — Encounter (INDEPENDENT_AMBULATORY_CARE_PROVIDER_SITE_OTHER): Payer: Self-pay | Admitting: Orthopaedic Surgery

## 2018-03-06 DIAGNOSIS — M25571 Pain in right ankle and joints of right foot: Secondary | ICD-10-CM

## 2018-03-06 DIAGNOSIS — Z6841 Body Mass Index (BMI) 40.0 and over, adult: Secondary | ICD-10-CM

## 2018-03-06 HISTORY — DX: Pain in right ankle and joints of right foot: M25.571

## 2018-03-06 NOTE — Progress Notes (Signed)
Office Visit Note   Patient: Stacey Robinson           Date of Birth: 01/18/1991           MRN: [id number] Visit Date: 03/06/2018              Requested by: No referring provider defined for this encounter. PCP: Patient, No Pcp Per   Assessment & Plan: Visit Diagnoses:  1. Pain in right ankle and joints of right foot   2. Morbid obesity (HCC)   3. Body mass index 50.0-59.9, adult (HCC)     Plan: Impression is status post ORIF left ankle trimalleolar fracture with syndesmosis fixation.  The fracture is nearly healed at this point.  We will transition the patient from a cam walker into an ASO.  She can be weightbearing as tolerated.  An updated physical therapy prescription was given to her today.  We will also write her a work note to start back on 03/12/2018.  She will follow-up with [place] in 6 weeks time for repeat evaluation and 3 view x-rays of the left ankle.  Call with concerns or questions in the meantime. The patient meets the AMA guidelines for Morbid (severe) obesity with a BMI > 40.0 and I have recommended weight loss.   Follow-Up Instructions: Return in about 6 weeks (around 04/17/2018).   Orders:  Orders Placed This Encounter  Procedures  . XR Ankle Complete Left   No orders of the defined types were placed in this encounter.     Procedures: No procedures performed   Clinical Data: No additional findings.   Subjective: Chief Complaint  Patient presents with  . Right Ankle - Follow-up    HPI patient is a pleasant 27 year old female who presents to our clinic today 94 days status post ORIF left ankle trimalleolar fracture with syndesmosis fixation, date of surgery 12/02/2017.  She has been partial weightbearing over the past few weeks.  She has been in formal physical therapy working on range of motion.  Minimal to no pain.  Review of Systems as detailed in HPI.  All others reviewed and are negative.   Objective: Vital Signs: There were no vitals taken for  this visit.  Physical Exam well-developed well-nourished female in no acute distress.  Alert and oriented x3.  Ortho Exam examination of left ankle reveals well-healed surgical incisions without evidence of infection.  No swelling.  No tenderness.  Decreased sensation lateral aspect.  Calf is soft and nontender.  She is neurovascular intact distally.  Specialty Comments:  No specialty comments available.  Imaging: Xr Ankle Complete Left  Result Date: 03/06/2018 Stable alignment of the hardware with great bony consolidation    PMFS History: Patient Active Problem List   Diagnosis Date Noted  . Pain in right ankle and joints of right foot 03/06/2018  . Closed displaced trimalleolar fracture of left lower leg 01/17/2018  . Morbid obesity (HCC) 01/17/2018  . Body mass index 50.0-59.9, adult (HCC) 01/17/2018   Past Medical History:  Diagnosis Date  . Trimalleolar fracture of ankle, closed    Left    History reviewed. No pertinent family history.  Past Surgical History:  Procedure Laterality Date  . CESAREAN SECTION     x 3  . CHOLECYSTECTOMY     2011  . ORIF ANKLE FRACTURE Left 12/02/2017   Procedure: OPEN REDUCTION INTERNAL FIXATION (ORIF) LEFT TRIMALLEOLAR ANKLE FRACTURE;  Surgeon: Tarry Kos, MD;  Location: [facility] OR;  Service: Orthopedics;  Laterality:  Left;   Social History   Occupational History  . Not on file  Tobacco Use  . Smoking status: Never Smoker  . Smokeless tobacco: Never Used  Substance and Sexual Activity  . Alcohol use: Never    Frequency: Never  . Drug use: Never  . Sexual activity: Yes    Birth control/protection: Implant    Comment: Expired implant in arm

## 2018-03-14 ENCOUNTER — Encounter

## 2018-03-18 ENCOUNTER — Ambulatory Visit: Payer: Self-pay | Attending: Orthopaedic Surgery

## 2018-03-18 DIAGNOSIS — M25672 Stiffness of left ankle, not elsewhere classified: Secondary | ICD-10-CM

## 2018-03-18 DIAGNOSIS — M25572 Pain in left ankle and joints of left foot: Secondary | ICD-10-CM

## 2018-03-18 DIAGNOSIS — R262 Difficulty in walking, not elsewhere classified: Secondary | ICD-10-CM

## 2018-03-18 NOTE — Therapy (Signed)
[facility] Outpatient Rehabilitation [facility] [address] [place], [place], [address] Phone: [phone]   Fax:  [phone]  Physical Therapy Treatment/Re-eval  Patient Details  Name: Stacey Robinson MRN: [id number] Date of Birth: 1991/02/19 Referring Provider (PT): Gershon Mussel, MD   Encounter Date: 03/18/2018  PT End of Session - 03/18/18 1053    Visit Number  2    Number of Visits  20    Date for PT Re-Evaluation  05/23/18    Authorization Type  self pay    PT Start Time  1055    PT Stop Time  1140    PT Time Calculation (min)  45 min    Activity Tolerance  Patient tolerated treatment well    Behavior During Therapy  [facility] for tasks assessed/performed       Past Medical History:  Diagnosis Date  . Trimalleolar fracture of ankle, closed    Left    Past Surgical History:  Procedure Laterality Date  . CESAREAN SECTION     x 3  . CHOLECYSTECTOMY     2011  . ORIF ANKLE FRACTURE Left 12/02/2017   Procedure: OPEN REDUCTION INTERNAL FIXATION (ORIF) LEFT TRIMALLEOLAR ANKLE FRACTURE;  Surgeon: Tarry Kos, MD;  [facility]: [facility] OR;  Service: Orthopedics;  Laterality: Left;    There were no vitals filed for this visit.  Subjective Assessment - 03/18/18 1057    Subjective  She reports did not come and does not have reason.    MD said can do as able on foot. She reports doing HEP dailly.   Returned to work as Engineer, civil (consulting) and pain incr some.     Patient is accompained by:  Family member    Limitations  Standing;Walking    Patient Stated Goals  return to walking    Currently in Pain?  Yes    Pain Score  3     Pain Location  Ankle    Pain Orientation  Left    Pain Descriptors / Indicators  Aching    Pain Type  Chronic pain    Pain Onset  More than a month ago    Pain Frequency  Intermittent    Multiple Pain Sites  Yes    Pain Score  0    Pain Location  Back    Pain Orientation  Right;Left;Lower    Pain Descriptors / Indicators  Aching    Pain Type  Chronic  pain    Pain Onset  More than a month ago    Pain Frequency  Intermittent    Aggravating Factors   standing    Pain Relieving Factors  sit /rest         [facility] PT Assessment - 03/18/18 0001      Assessment   Medical Diagnosis  trimalleolar fx, Lt s/p ORIF    Referring Provider (PT)  Gershon Mussel, MD    Onset Date/Surgical Date  12/02/17    Next MD Visit  04/2018    Prior Therapy  1 visit      Restrictions   Weight Bearing Restrictions  No    LLE Weight Bearing  Weight bearing as tolerated      Home Environment   Living Environment  Private residence    Living Arrangements  Spouse/significant other    Additional Comments  2 steps to enter home without hand rails      AROM   Left Ankle Dorsiflexion  90    Left Ankle Plantar Flexion  35  Left Ankle Inversion  35    Left Ankle Eversion  5      PROM   Right/Left Ankle  Left    Left Ankle Dorsiflexion  92    Left Ankle Plantar Flexion  50      Ambulation/Gait   Gait Comments  No device , decr weight to LT leg held abducted                           PT Education - 03/18/18 1123    Education Details  HEP, POC    Person(s) Educated  Patient;Spouse    Methods  Explanation;Demonstration;Tactile cues;Verbal cues;Handout    Comprehension  Returned demonstration;Verbalized understanding       PT Short Term Goals - 03/18/18 1055      PT SHORT TERM GOAL #1   Title  Pt will demo proper gait pattern within weight bearing restrictions    Time  4    Period  Weeks    Status  New      PT SHORT TERM GOAL #2   Title  pt will be independent in use of RICE techniques to keep pain <=4/10 with daily activities    Time  4    Period  Weeks    Status  New        PT Long Term Goals - 03/18/18 1056      PT LONG TERM GOAL #1   Title  FOTO to 37% limited    Baseline  61% limited at eval    Time  10    Period  Weeks    Status  New      PT LONG TERM GOAL #2   Title  Pt will be able to ambulate for at least  1 hour pain <=2/10    Time  10    Period  Weeks    Status  New      PT LONG TERM GOAL #3   Title  DF ROM to +10 passively and +5 actively    Time  10    Period  Weeks    Status  New      PT LONG TERM GOAL #4   Title  pt will be able to perform all house, self and child-care activities without limitaiton by ankle    Time  10    Period  Weeks    Status  New      PT LONG TERM GOAL #5   Title  Pt will demo SLS control for at least 5 s    Time  10    Period  Weeks    Status  New            Plan - 03/18/18 1054    Clinical Impression Statement  Pt returns after extended period. Now WBAT and no restrictions  ROM slightly Better  and now not using device    PT Treatment/Interventions  ADLs/Self Care Home Management;Cryotherapy;Electrical Stimulation;Therapeutic exercise;Therapeutic activities;Moist Heat;Functional mobility training;Stair training;Gait training;Balance training;Neuromuscular re-education;Patient/family education;Manual techniques;Vasopneumatic Device;Taping;Passive range of motion;Scar mobilization    PT Next Visit Plan  Progress ROM and strength  , modalities as needed closed chain, FOTO   PT Home Exercise Plan  toe yoga, towel scrunches, seated heel/toe raises, passive DF stretch seated    Consulted and Agree with Plan of Care  Patient       Patient will benefit from skilled therapeutic intervention in order to improve the  following deficits and impairments:  Abnormal gait, Increased edema, Decreased scar mobility, Decreased activity tolerance, Pain, Decreased strength, Difficulty walking, Decreased balance, Decreased mobility, Decreased range of motion, Improper body mechanics, Postural dysfunction, Impaired flexibility  Visit Diagnosis: Pain in left ankle and joints of left foot  Stiffness of left ankle, not elsewhere classified  Difficulty in walking, not elsewhere classified     Problem List Patient Active Problem List   Diagnosis Date Noted  . Pain  in right ankle and joints of right foot 03/06/2018  . Closed displaced trimalleolar fracture of left lower leg 01/17/2018  . Morbid obesity (HCC) 01/17/2018  . Body mass index 50.0-59.9, adult (HCC) 01/17/2018    Caprice Red  PT 03/18/2018, 11:41 AM  [facility] [address] [place], [place], [address] Phone: [phone]   Fax:  [phone]  Name: Mende Biswell MRN: [id number] Date of Birth: 08-02-1990

## 2018-03-18 NOTE — Patient Instructions (Signed)
DF stretch with towel and over pressure.  30 sec x 3-5 3x/day.   Standing weight shift and balance,   Band exer 4 way red x 10-20

## 2018-03-20 ENCOUNTER — Ambulatory Visit: Payer: Self-pay | Admitting: Physical Therapy

## 2018-03-20 ENCOUNTER — Encounter: Payer: Self-pay | Admitting: Physical Therapy

## 2018-03-20 DIAGNOSIS — R262 Difficulty in walking, not elsewhere classified: Secondary | ICD-10-CM

## 2018-03-20 DIAGNOSIS — M25572 Pain in left ankle and joints of left foot: Secondary | ICD-10-CM

## 2018-03-20 DIAGNOSIS — M25672 Stiffness of left ankle, not elsewhere classified: Secondary | ICD-10-CM

## 2018-03-20 NOTE — Therapy (Signed)
[facility] Outpatient Rehabilitation [facility] [address] [place], [place], [address] Phone: [phone]   Fax:  [phone]  Physical Therapy Treatment  Patient Details  Name: Stacey Robinson MRN: [id number] Date of Birth: 1991-05-23 Referring Provider (PT): Gershon Mussel, MD   Encounter Date: 03/20/2018  PT End of Session - 03/20/18 1130    Visit Number  3    Number of Visits  20    Date for PT Re-Evaluation  05/23/18    Authorization Type  self pay    PT Start Time  1017    PT Stop Time  1104    PT Time Calculation (min)  47 min    Activity Tolerance  Patient tolerated treatment well    Behavior During Therapy  [facility] for tasks assessed/performed       Past Medical History:  Diagnosis Date  . Trimalleolar fracture of ankle, closed    Left    Past Surgical History:  Procedure Laterality Date  . CESAREAN SECTION     x 3  . CHOLECYSTECTOMY     2011  . ORIF ANKLE FRACTURE Left 12/02/2017   Procedure: OPEN REDUCTION INTERNAL FIXATION (ORIF) LEFT TRIMALLEOLAR ANKLE FRACTURE;  Surgeon: Tarry Kos, MD;  [facility];  Service: Orthopedics;  Laterality: Left;    There were no vitals filed for this visit.  Subjective Assessment - 03/20/18 1024    Subjective  She has a quad cane at home. Works night shift.  wears ASO during her awake shift.    Patient is accompained by:  Family member    Currently in Pain?  Yes    Pain Score  0-No pain   up to 3/10  regular days.  pain after work 6/10   Pain Location  Ankle    Pain Orientation  Left    Pain Descriptors / Indicators  Aching;Throbbing;Burning;Tightness   burning along incision   Pain Type  Chronic pain    Pain Radiating Towards  medial mid leg    Pain Frequency  Intermittent    Aggravating Factors   standing longer,  walking without brace hurts,     Multiple Pain Sites  --   low back hurts  since off crutches 2 weeks,  right foot cramps sometimes   Pain Score  5    Pain Location  Back    Pain  Orientation  Right;Left;Lower    Pain Descriptors / Indicators  Burning    Aggravating Factors   standing    Pain Relieving Factors  bending over                       [facility] Adult PT Treatment/Exercise - 03/20/18 0001      Ambulation/Gait   Gait Comments  single point cane and parallel bars.  Patient increased back pain 5-6/10 with cane 83 feet.  Back felt better walking without cane however good foot started to cramp.  patient has little use of quad strength as she walks with knees hyperextended.  In parallel bars She tried to walk without locking  knees.  she was unable.      Ankle Exercises: Seated   Towel Crunch  --   3 sets of 10.   Towel Crunch Weights (lbs)  3 sets of 10,  pulling 1 mouse    Heel Raises  10 reps    Toe Raise  10 reps    Heel Slides  10 reps    Heel Slides Limitations  10 X using a towel for stretchboth directions.             PT Education - 03/20/18 1130    Education Details  gait training    Person(s) Educated  Patient    Methods  Explanation;Demonstration;Verbal cues    Comprehension  Verbalized understanding;Need further instruction       PT Short Term Goals - 03/18/18 1055      PT SHORT TERM GOAL #1   Title  Pt will demo proper gait pattern within weight bearing restrictions    Time  4    Period  Weeks    Status  New      PT SHORT TERM GOAL #2   Title  pt will be independent in use of RICE techniques to keep pain <=4/10 with daily activities    Time  4    Period  Weeks    Status  New        PT Long Term Goals - 03/18/18 1056      PT LONG TERM GOAL #1   Title  FOTO to 37% limited    Baseline  61% limited at eval    Time  10    Period  Weeks    Status  New      PT LONG TERM GOAL #2   Title  Pt will be able to ambulate for at least 1 hour pain <=2/10    Time  10    Period  Weeks    Status  New      PT LONG TERM GOAL #3   Title  DF ROM to +10 passively and +5 actively    Time  10    Period  Weeks    Status   New      PT LONG TERM GOAL #4   Title  pt will be able to perform all house, self and child-care activities without limitaiton by ankle    Time  10    Period  Weeks    Status  New      PT LONG TERM GOAL #5   Title  Pt will demo SLS control for at least 5 s    Time  10    Period  Weeks    Status  New            Plan - 03/20/18 1130    Clinical Impression Statement  Patient able to walk 83 feet with single point cane with 5-6/10 back pain. No ankle pain at end of session with foot feeling a lot better. Back pain 5/10 .     PT Next Visit Plan  Progress ROM and strength  , modalities as needed    PT Home Exercise Plan  toe yoga, towel scrunches, seated heel/toe raises, passive DF stretch seated    Consulted and Agree with Plan of Care  Patient       Patient will benefit from skilled therapeutic intervention in order to improve the following deficits and impairments:     Visit Diagnosis: Pain in left ankle and joints of left foot  Stiffness of left ankle, not elsewhere classified  Difficulty in walking, not elsewhere classified     Problem List Patient Active Problem List   Diagnosis Date Noted  . Pain in right ankle and joints of right foot 03/06/2018  . Closed displaced trimalleolar fracture of left lower leg 01/17/2018  . Morbid obesity (HCC) 01/17/2018  . Body mass index 50.0-59.9, adult (HCC)  01/17/2018    HARRIS,KAREN  PTA 03/20/2018, 11:35 AM  [facility] [address] [place], [place], [address] Phone: [phone]   Fax:  [phone]  Name: Stacey Robinson MRN: [id number] Date of Birth: Jan 21, 1991

## 2018-03-25 ENCOUNTER — Ambulatory Visit: Payer: Self-pay

## 2018-03-25 DIAGNOSIS — M25672 Stiffness of left ankle, not elsewhere classified: Secondary | ICD-10-CM

## 2018-03-25 DIAGNOSIS — R262 Difficulty in walking, not elsewhere classified: Secondary | ICD-10-CM

## 2018-03-25 DIAGNOSIS — M25572 Pain in left ankle and joints of left foot: Secondary | ICD-10-CM

## 2018-03-25 NOTE — Therapy (Signed)
[place], [place], [address] Phone: [phone]   Fax:  [phone]  Physical Therapy Treatment  Patient Details  Name: Stacey Robinson MRN: [id number] Date of Birth: 1990-09-23 Referring Provider (PT): Frankey Shown, MD   Encounter Date: 03/25/2018  PT End of Session - 03/25/18 0931    Visit Number  4    Number of Visits  20    Date for PT Re-Evaluation  05/23/18    Authorization Type  self pay    PT Start Time  0931    PT Stop Time  1015    PT Time Calculation (min)  44 min    Activity Tolerance  Patient tolerated treatment well    Behavior During Therapy  [facility] for tasks assessed/performed       Past Medical History:  Diagnosis Date  . Trimalleolar fracture of ankle, closed    Left    Past Surgical History:  Procedure Laterality Date  . CESAREAN SECTION     x 3  . CHOLECYSTECTOMY     2011  . ORIF ANKLE FRACTURE Left 12/02/2017   Procedure: OPEN REDUCTION INTERNAL FIXATION (ORIF) LEFT TRIMALLEOLAR ANKLE FRACTURE;  Surgeon: Leandrew Koyanagi, MD;  Location: [place];  Service: Orthopedics;  Laterality: Left;    There were no vitals filed for this visit.  Subjective Assessment - 03/25/18 0935    Subjective  oing ok ., Pain 2 today    Pain Score  2     Pain Location  Ankle    Pain Orientation  Left    Pain Descriptors / Indicators  Aching;Throbbing;Tightness    Pain Type  Chronic pain    Pain Onset  More than a month ago    Pain Frequency  Intermittent    Aggravating Factors   stand and walking    Multiple Pain Sites  --   occasional Rt foot , LBP   Pain Score  5    Pain Location  Back    Pain Orientation  Lower    Pain Descriptors / Indicators  Burning    Pain Onset  More than a month ago    Pain Frequency  Intermittent    Aggravating Factors   standing    Pain Relieving Factors  flex spine         [facility] PT Assessment - 03/25/18 0001      AROM   Left Ankle Dorsiflexion  90    Left  Ankle Plantar Flexion  40    Left Ankle Inversion  35    Left Ankle Eversion  10                   [facility] Adult PT Treatment/Exercise - 03/25/18 0001      Manual Therapy   Manual Therapy  Joint mobilization;Passive ROM    Joint Mobilization  AP glide to talus with DF stretch form pt 3 x 10 reps  and discussed useing strap at home if able to facilitate DF    Passive ROM  DF stretching       Ankle Exercises: Aerobic   Nustep  L5 79mn LE only      Ankle Exercises: Stretches   Other Stretch  with slant board with knee flexion for DF       Ankle Exercises: Seated   BAPS Weights (lbs)  Greens band x 15 4 way LT ankle      Walking with narrower base of support for  incr weight and stretch through ankle         PT Short Term Goals - 03/25/18 1015      PT SHORT TERM GOAL #1   Title  Pt will demo proper gait pattern within weight bearing restrictions    Status  On-going      PT SHORT TERM GOAL #2   Title  pt will be independent in use of RICE techniques to keep pain <=4/10 with daily activities    Status  Partially Met      PT SHORT TERM GOAL #3   Title  She will be independnet with initial HEP    Time  4    Period  Weeks    Status  New        PT Long Term Goals - 03/18/18 1056      PT LONG TERM GOAL #1   Title  FOTO to 37% limited    Baseline  61% limited at eval    Time  10    Period  Weeks    Status  New      PT LONG TERM GOAL #2   Title  Pt will be able to ambulate for at least 1 hour pain <=2/10    Time  10    Period  Weeks    Status  New      PT LONG TERM GOAL #3   Title  DF ROM to +10 passively and +5 actively    Time  10    Period  Weeks    Status  New      PT LONG TERM GOAL #4   Title  pt will be able to perform all house, self and child-care activities without limitaiton by ankle    Time  10    Period  Weeks    Status  New      PT LONG TERM GOAL #5   Title  Pt will demo SLS control for at least 5 s    Time  10    Period  Weeks     Status  New            Plan - 03/25/18 0931    Clinical Impression Statement  Continue with stifness and decr weight bearing Lt LE. She walks with knee hyper ext and has more valgus LT knee so may walk with less weight LT and anormal pattern normally but stiffness with DF is limting wlaking. She should cont to use brace for safety in community .     PT Treatment/Interventions  ADLs/Self Care Home Management;Cryotherapy;Electrical Stimulation;Therapeutic exercise;Therapeutic activities;Moist Heat;Functional mobility training;Stair training;Gait training;Balance training;Neuromuscular re-education;Patient/family education;Manual techniques;Vasopneumatic Device;Taping;Passive range of motion;Scar mobilization    PT Next Visit Plan  Progress ROM and strength  , modalities as needed    PT Home Exercise Plan  toe yoga, towel scrunches, seated heel/toe raises, passive DF stretch seated    Consulted and Agree with Plan of Care  Patient       Patient will benefit from skilled therapeutic intervention in order to improve the following deficits and impairments:  Abnormal gait, Increased edema, Decreased scar mobility, Decreased activity tolerance, Pain, Decreased strength, Difficulty walking, Decreased balance, Decreased mobility, Decreased range of motion, Improper body mechanics, Postural dysfunction, Impaired flexibility  Visit Diagnosis: Pain in left ankle and joints of left foot  Stiffness of left ankle, not elsewhere classified  Difficulty in walking, not elsewhere classified     Problem List Patient Active Problem List  Diagnosis Date Noted  . Pain in right ankle and joints of right foot 03/06/2018  . Closed displaced trimalleolar fracture of left lower leg 01/17/2018  . Morbid obesity ([place]) 01/17/2018  . Body mass index 50.0-59.9, adult ([place]) 01/17/2018    Darrel Hoover  PT 03/25/2018, 10:16 AM  [facility] [address] [place], [place], [address] Phone: [phone]   Fax:  [phone]  Name: Khila Papp MRN: [id number] Date of Birth: 05/29/1991

## 2018-03-28 ENCOUNTER — Encounter

## 2018-03-31 ENCOUNTER — Ambulatory Visit: Payer: Self-pay

## 2018-03-31 DIAGNOSIS — M25672 Stiffness of left ankle, not elsewhere classified: Secondary | ICD-10-CM

## 2018-03-31 DIAGNOSIS — R262 Difficulty in walking, not elsewhere classified: Secondary | ICD-10-CM

## 2018-03-31 DIAGNOSIS — M25572 Pain in left ankle and joints of left foot: Secondary | ICD-10-CM

## 2018-03-31 NOTE — Therapy (Signed)
[facility] Outpatient Rehabilitation [facility] [address] [place], [place], [address] Phone: [phone]   Fax:  [phone]  Physical Therapy Treatment  Patient Details  Name: Stacey Robinson MRN: [id number] Date of Birth: 09-08-1990 Referring Provider (PT): Gershon Mussel, MD   Encounter Date: 03/31/2018  PT End of Session - 03/31/18 1019    Visit Number  5    Number of Visits  20    Date for PT Re-Evaluation  05/23/18    Authorization Type  self pay    PT Start Time  1020    PT Stop Time  1100    PT Time Calculation (min)  40 min    Activity Tolerance  Patient tolerated treatment well    Behavior During Therapy  [facility] for tasks assessed/performed       Past Medical History:  Diagnosis Date  . Trimalleolar fracture of ankle, closed    Left    Past Surgical History:  Procedure Laterality Date  . CESAREAN SECTION     x 3  . CHOLECYSTECTOMY     2011  . ORIF ANKLE FRACTURE Left 12/02/2017   Procedure: OPEN REDUCTION INTERNAL FIXATION (ORIF) LEFT TRIMALLEOLAR ANKLE FRACTURE;  Surgeon: Tarry Kos, MD;  [facility];  Service: Orthopedics;  Laterality: Left;    There were no vitals filed for this visit.  Subjective Assessment - 03/31/18 1020    Subjective  Ankle feeling better and walking better.     Patient is accompained by:  Family member    Limitations  Reading    Currently in Pain?  No/denies                       [facility] Adult PT Treatment/Exercise - 03/31/18 0001      Manual Therapy   Joint Mobilization  AP glide to talus with DF stretch form pt 3 x 10 reps  and discussed useing strap at home if able to facilitate DF    Passive ROM  DF stretching 3 x 30 slant board      Ankle Exercises: Standing   Heel Raises  15 reps;Both    Other Standing Ankle Exercises  step to 8 inch step RT foot 3x 10       Ankle Exercises: Seated   Toe Raise  --   25 reps, 20 inversion ROM   Heel Slides  Right   with 20 reps DF stretch heel  to floor             PT Education - 03/31/18 1051    Education Details  practice walking back and weight shifting  to LT and lifting RT foot off floor with UE supportr    Person(s) Educated  Patient;Spouse    Methods  Explanation;Demonstration;Verbal cues    Comprehension  Verbalized understanding;Returned demonstration       PT Short Term Goals - 03/31/18 1023      PT SHORT TERM GOAL #1   Title  Pt will demo proper gait pattern within weight bearing restrictions    Baseline  still decr weight and stride     Status  On-going      PT SHORT TERM GOAL #2   Title  pt will be independent in use of RICE techniques to keep pain <=4/10 with daily activities    Baseline  doesn't ice due to no swelling    Status  Deferred      PT SHORT TERM GOAL #3  Title  She will be independnet with initial HEP    Status  Achieved        PT Long Term Goals - 03/18/18 1056      PT LONG TERM GOAL #1   Title  FOTO to 37% limited    Baseline  61% limited at eval    Time  10    Period  Weeks    Status  New      PT LONG TERM GOAL #2   Title  Pt will be able to ambulate for at least 1 hour pain <=2/10    Time  10    Period  Weeks    Status  New      PT LONG TERM GOAL #3   Title  DF ROM to +10 passively and +5 actively    Time  10    Period  Weeks    Status  New      PT LONG TERM GOAL #4   Title  pt will be able to perform all house, self and child-care activities without limitaiton by ankle    Time  10    Period  Weeks    Status  New      PT LONG TERM GOAL #5   Title  Pt will demo SLS control for at least 5 s    Time  10    Period  Weeks    Status  New            Plan - 03/31/18 1020    Clinical Impression Statement  ROM improved as she is able to DF past 90 degrees but still limited . She cont decr weight to Lt . Some of this may be realed to LT knee valgus with DF stiffness    PT Treatment/Interventions  ADLs/Self Care Home Management;Cryotherapy;Electrical  Stimulation;Therapeutic exercise;Therapeutic activities;Moist Heat;Functional mobility training;Stair training;Gait training;Balance training;Neuromuscular re-education;Patient/family education;Manual techniques;Vasopneumatic Device;Taping;Passive range of motion;Scar mobilization    PT Next Visit Plan  Progress ROM and strength  , modalities as needed    PT Home Exercise Plan  toe yoga, towel scrunches, seated heel/toe raises, passive DF stretch seated    Consulted and Agree with Plan of Care  Patient;Family member/caregiver    Family Member Consulted  spouse       Patient will benefit from skilled therapeutic intervention in order to improve the following deficits and impairments:  Abnormal gait, Increased edema, Decreased scar mobility, Decreased activity tolerance, Pain, Decreased strength, Difficulty walking, Decreased balance, Decreased mobility, Decreased range of motion, Improper body mechanics, Postural dysfunction, Impaired flexibility  Visit Diagnosis: Pain in left ankle and joints of left foot  Stiffness of left ankle, not elsewhere classified  Difficulty in walking, not elsewhere classified     Problem List Patient Active Problem List   Diagnosis Date Noted  . Pain in right ankle and joints of right foot 03/06/2018  . Closed displaced trimalleolar fracture of left lower leg 01/17/2018  . Morbid obesity (HCC) 01/17/2018  . Body mass index 50.0-59.9, adult (HCC) 01/17/2018    Caprice Red  PT  03/31/2018, 11:01 AM  [facility] [address] [place], [place], [address] Phone: [phone]   Fax:  [phone]  Name: Stacey Robinson MRN: [id number] Date of Birth: Apr 26, 1991

## 2018-04-02 ENCOUNTER — Encounter: Payer: Self-pay | Admitting: Physical Therapy

## 2018-04-02 ENCOUNTER — Ambulatory Visit: Payer: Self-pay | Admitting: Physical Therapy

## 2018-04-02 DIAGNOSIS — M25572 Pain in left ankle and joints of left foot: Secondary | ICD-10-CM

## 2018-04-02 DIAGNOSIS — R262 Difficulty in walking, not elsewhere classified: Secondary | ICD-10-CM

## 2018-04-02 DIAGNOSIS — M25672 Stiffness of left ankle, not elsewhere classified: Secondary | ICD-10-CM

## 2018-04-02 NOTE — Patient Instructions (Signed)
Stretch calf with toes on 2 inch platform Daily 3 x 30 seconds.

## 2018-04-02 NOTE — Therapy (Signed)
[facility] [address] [place], [place], [address] Phone: [phone]   Fax:  [phone]  Physical Therapy Treatment  Patient Details  Name: Stacey Robinson MRN: [id number] Date of Birth: 06/16/1990 Referring Provider (PT): Gershon Mussel, MD   Encounter Date: 04/02/2018  PT End of Session - 04/02/18 1302    Visit Number  6    Number of Visits  20    Date for PT Re-Evaluation  05/23/18    Authorization Type  self pay    PT Start Time  1104    PT Stop Time  1150    PT Time Calculation (min)  46 min    Activity Tolerance  Patient tolerated treatment well    Behavior During Therapy  [facility] for tasks assessed/performed       Past Medical History:  Diagnosis Date  . Trimalleolar fracture of ankle, closed    Left    Past Surgical History:  Procedure Laterality Date  . CESAREAN SECTION     x 3  . CHOLECYSTECTOMY     2011  . ORIF ANKLE FRACTURE Left 12/02/2017   Procedure: OPEN REDUCTION INTERNAL FIXATION (ORIF) LEFT TRIMALLEOLAR ANKLE FRACTURE;  Surgeon: Tarry Kos, MD;  Location[facility];  Service: Orthopedics;  Laterality: Left;    There were no vitals filed for this visit.  Subjective Assessment - 04/02/18 1107    Subjective  Husband has been working scar and no pain in ankle,  back pain 6/10 with longer tanding walking. Brace in daytime. Does the HEP,  walking limited to fatigue,  limp theen back pain    Patient is accompained by:  Family member   mom   Currently in Pain?  No/denies    Pain Location  Ankle    Aggravating Factors   standing,     Pain Score  3    Pain Location  Back    Pain Orientation  Lower   radiates up back   Pain Descriptors / Indicators  Burning    Pain Type  Chronic pain    Pain Frequency  Intermittent    Aggravating Factors   standing    Pain Relieving Factors  sit         [facility] PT Assessment - 04/02/18 0001      AROM   Left Ankle Dorsiflexion  90    Left Ankle Plantar Flexion   43    Left Ankle Inversion  34    Left Ankle Eversion  14   extra time     PROM   Left Ankle Dorsiflexion  3                   [facility] Adult PT Treatment/Exercise - 04/02/18 0001      Ambulation/Gait   Pre-Gait Activities  weight shifting  strain  vs pain    Gait Comments  parallel bars  cued ,        Ankle Exercises: Standing   SLS  5 X with hand hold,  unable to hold without hands    Heel Raises  15 reps   in bars,  increased back pain   Other Standing Ankle Exercises  wall slides facing wall 10 x each moderate cues,  noted decreased weight shifting onto left.  patient tried bent knee walking in bars, terminal knee control challamging      Ankle Exercises: Stretches   Gastroc Stretch  --   platform, runner's sitting  multiple reps,cues brace  on /off   Other Stretch  rocker board sitting heel presses for DF  ROM 5 second holds             PT Education - 04/02/18 1154    Education Details  HEP    Person(s) Educated  Patient;Parent(s)    Methods  Explanation;Demonstration;Verbal cues    Comprehension  Verbalized understanding;Returned demonstration       PT Short Term Goals - 03/31/18 1023      PT SHORT TERM GOAL #1   Title  Pt will demo proper gait pattern within weight bearing restrictions    Baseline  still decr weight and stride     Status  On-going      PT SHORT TERM GOAL #2   Title  pt will be independent in use of RICE techniques to keep pain <=4/10 with daily activities    Baseline  doesn't ice due to no swelling    Status  Deferred      PT SHORT TERM GOAL #3   Title  She will be independnet with initial HEP    Status  Achieved        PT Long Term Goals - 03/18/18 1056      PT LONG TERM GOAL #1   Title  FOTO to 37% limited    Baseline  61% limited at eval    Time  10    Period  Weeks    Status  New      PT LONG TERM GOAL #2   Title  Pt will be able to ambulate for at least 1 hour pain <=2/10    Time  10    Period  Weeks     Status  New      PT LONG TERM GOAL #3   Title  DF ROM to +10 passively and +5 actively    Time  10    Period  Weeks    Status  New      PT LONG TERM GOAL #4   Title  pt will be able to perform all house, self and child-care activities without limitaiton by ankle    Time  10    Period  Weeks    Status  New      PT LONG TERM GOAL #5   Title  Pt will demo SLS control for at least 5 s    Time  10    Period  Weeks    Status  New            Plan - 04/02/18 1303    Clinical Impression Statement  PROM 3 degrees DF. Patient's mother said patient has always worn one of her shoes out more and has walked/ stood with her knees hyperextended.  Patient is able to improve gait short periods of time.  Back pain limits standing exercises.  Small gaines with ROM/  gait.     PT Next Visit Plan  Progress ROM and strength  , modalities as needed    PT Home Exercise Plan  toe yoga, towel scrunches, seated heel/toe raises, passive DF stretch seated,  standing calf stretch on 2 inch platform.     Consulted and Agree with Plan of Care  Patient    Family Member Consulted  mother.        Patient will benefit from skilled therapeutic intervention in order to improve the following deficits and impairments:     Visit Diagnosis: Pain in left ankle and joints  of left foot  Stiffness of left ankle, not elsewhere classified  Difficulty in walking, not elsewhere classified     Problem List Patient Active Problem List   Diagnosis Date Noted  . Pain in right ankle and joints of right foot 03/06/2018  . Closed displaced trimalleolar fracture of left lower leg 01/17/2018  . Morbid obesity (HCC) 01/17/2018  . Body mass index 50.0-59.9, adult (HCC) 01/17/2018    Abundio Teuscher PTA 04/02/2018, 1:17 PM  [facility] [address] [place], [place], [address] Phone: [phone]   Fax:  [phone]  Name: Stacey Robinson MRN: [id number] Date of  Birth: 1990-09-13

## 2018-04-07 ENCOUNTER — Ambulatory Visit: Payer: Self-pay

## 2018-04-07 DIAGNOSIS — M25572 Pain in left ankle and joints of left foot: Secondary | ICD-10-CM

## 2018-04-07 DIAGNOSIS — M25672 Stiffness of left ankle, not elsewhere classified: Secondary | ICD-10-CM

## 2018-04-07 DIAGNOSIS — R262 Difficulty in walking, not elsewhere classified: Secondary | ICD-10-CM

## 2018-04-07 NOTE — Patient Instructions (Signed)
Lumbar stabilization exer Level 1 PPT, bridge with clam and adduction 15 reps 1-2x/day hold 2-5 sec

## 2018-04-07 NOTE — Therapy (Signed)
[place], [place], [address] Phone: [phone]   Fax:  [phone]  Physical Therapy Treatment  Patient Details  Name: Stacey Robinson MRN: [id number] Date of Birth: 04/14/1991 Referring Provider (PT): Frankey Shown, MD   Encounter Date: 04/07/2018  PT End of Session - 04/07/18 1100    Visit Number  7    Number of Visits  20    Date for PT Re-Evaluation  05/23/18    Authorization Type  self pay    PT Start Time  1102    PT Stop Time  1146    PT Time Calculation (min)  44 min    Activity Tolerance  Patient tolerated treatment well    Behavior During Therapy  [facility] for tasks assessed/performed       Past Medical History:  Diagnosis Date  . Trimalleolar fracture of ankle, closed    Left    Past Surgical History:  Procedure Laterality Date  . CESAREAN SECTION     x 3  . CHOLECYSTECTOMY     2011  . ORIF ANKLE FRACTURE Left 12/02/2017   Procedure: OPEN REDUCTION INTERNAL FIXATION (ORIF) LEFT TRIMALLEOLAR ANKLE FRACTURE;  Surgeon: Leandrew Koyanagi, MD;  Location: [place];  Service: Orthopedics;  Laterality: Left;    There were no vitals filed for this visit.  Subjective Assessment - 04/07/18 1105    Subjective  No ankle pain today Back still with intermittant pain.     Currently in Pain?  No/denies                       [facility] Adult PT Treatment/Exercise - 04/07/18 0001      Exercises   Exercises  Lumbar      Lumbar Exercises: Stretches   Pelvic Tilt  15 reps;5 seconds      Lumbar Exercises: Standing   Other Standing Lumbar Exercises  weight shifting with lifting RT knee with Lt foot in front and behind.       Lumbar Exercises: Supine   Bridge with [employer]  15 reps    Bridge with [employer] Limitations  No ball just adduction    Bridge with clamshell  15 reps    Bridge with [employer] Limitations  no band     Straight Leg Raise  10 reps    Straight Leg Raises  Limitations  RT/LT with ab set.       Ankle Exercises: Aerobic   Nustep  L5 27mn LE only      Ankle Exercises: Standing   Rocker Board Limitations  with center post with circles x 15 clockwise.  She may be too heavy for board and board made dracking sound but no cracks noted.     Heel Raises  Both;20 reps    Other Standing Ankle Exercises  stand at counter with side stepping RT and LT  10 feet  x 5 , toe touches RT/Lt  to back and side x 15 and 6 inch step up x 15 Lt foot cued to step side and not back with RT foot.  then single leg stand x 15 RT and Lt  with cue to increase time on feet to 3-5 sec       Ankle Exercises: Stretches   Slant Board Stretch  2 reps;30 seconds             PT Education - 04/07/18 1129    Education Details  HEP  Level 1  lumbar stab.     Person(s) Educated  Patient    Methods  Explanation;Verbal cues;Handout    Comprehension  Verbalized understanding;Returned demonstration       PT Short Term Goals - 04/07/18 1143      PT SHORT TERM GOAL #1   Title  Pt will demo proper gait pattern within weight bearing restrictions    Baseline  still decr weight and stride but improving      PT SHORT TERM GOAL #2   Title  pt will be independent in use of RICE techniques to keep pain <=4/10 with daily activities    Status  Deferred      PT SHORT TERM GOAL #3   Title  She will be independent with initial HEP    Status  Achieved        PT Long Term Goals - 04/07/18 1143      PT LONG TERM GOAL #1   Title  FOTO to 37% limited    Baseline  61% limited at eval    Time  10    Period  Weeks    Status  New      PT LONG TERM GOAL #2   Title  Pt will be able to ambulate for at least 1 hour pain <=2/10    Baseline  imcreased time per pt but still no 60 min    Time  10    Period  Weeks    Status  New      PT LONG TERM GOAL #3   Title  DF ROM to +10 passively and +5 actively    Baseline  3 degrees last session    Status  On-going      PT LONG TERM GOAL #4    Title  pt will be able to perform all house, self and child-care activities without limitaiton by ankle    Baseline  doing more due to decr ankle pain but back pain limits    Time  10    Period  Weeks    Status  Partially Met      PT LONG TERM GOAL #5   Title  Pt will demo SLS control for at least 5 s    Baseline  not able    Time  10    Period  Weeks    Status  On-going            Plan - 04/07/18 1100    Clinical Impression Statement  Added lumbar stab exer as this appears to be a limiting factor with home tasks and possible RTW.  She appears to be walking with incr weight to Lt foot.  She tolerates all  exercieses without report of increased pain.     PT Treatment/Interventions  ADLs/Self Care Home Management;Cryotherapy;Electrical Stimulation;Therapeutic exercise;Therapeutic activities;Moist Heat;Functional mobility training;Stair training;Gait training;Balance training;Neuromuscular re-education;Patient/family education;Manual techniques;Vasopneumatic Device;Taping;Passive range of motion;Scar mobilization    PT Next Visit Plan  Progress ROM and strength  , modalities as needed    PT Home Exercise Plan  toe yoga, towel scrunches, seated heel/toe raises, passive DF stretch seated,  standing calf stretch on 2 inch platform.     Consulted and Agree with Plan of Care  Patient    Family Member Consulted  spouse       Patient will benefit from skilled therapeutic intervention in order to improve the following deficits and impairments:  Abnormal gait, Increased edema, Decreased scar mobility, Decreased activity tolerance, Pain,  Decreased strength, Difficulty walking, Decreased balance, Decreased mobility, Decreased range of motion, Improper body mechanics, Postural dysfunction, Impaired flexibility  Visit Diagnosis: Pain in left ankle and joints of left foot  Stiffness of left ankle, not elsewhere classified  Difficulty in walking, not elsewhere classified     Problem  List Patient Active Problem List   Diagnosis Date Noted  . Pain in right ankle and joints of right foot 03/06/2018  . Closed displaced trimalleolar fracture of left lower leg 01/17/2018  . Morbid obesity ([place]) 01/17/2018  . Body mass index 50.0-59.9, adult ([place]) 01/17/2018    Darrel Hoover  PT 04/07/2018, 11:45 AM  [facility] [address] [place], [place], [address] Phone: [phone]   Fax:  [phone]  Name: Stacey Robinson MRN: [id number] Date of Birth: 10/26/1990

## 2018-04-09 ENCOUNTER — Ambulatory Visit: Payer: Self-pay | Admitting: Physical Therapy

## 2018-04-09 DIAGNOSIS — M25672 Stiffness of left ankle, not elsewhere classified: Secondary | ICD-10-CM

## 2018-04-09 DIAGNOSIS — M25572 Pain in left ankle and joints of left foot: Secondary | ICD-10-CM

## 2018-04-09 DIAGNOSIS — R262 Difficulty in walking, not elsewhere classified: Secondary | ICD-10-CM

## 2018-04-09 NOTE — Therapy (Signed)
[place], [place], [address] Phone: [phone]   Fax:  [phone]  Physical Therapy Treatment  Patient Details  Name: Stacey Robinson MRN: [id number] Date of Birth: 11-Jan-1991 Referring Provider (PT): Frankey Shown, MD   Encounter Date: 04/09/2018  PT End of Session - 04/09/18 1301    Visit Number  8    Number of Visits  20    Date for PT Re-Evaluation  05/23/18    Authorization Type  self pay    PT Start Time  1103    PT Stop Time  1145    PT Time Calculation (min)  42 min    Activity Tolerance  Patient tolerated treatment well    Behavior During Therapy  [facility] for tasks assessed/performed       Past Medical History:  Diagnosis Date  . Trimalleolar fracture of ankle, closed    Left    Past Surgical History:  Procedure Laterality Date  . CESAREAN SECTION     x 3  . CHOLECYSTECTOMY     2011  . ORIF ANKLE FRACTURE Left 12/02/2017   Procedure: OPEN REDUCTION INTERNAL FIXATION (ORIF) LEFT TRIMALLEOLAR ANKLE FRACTURE;  Surgeon: Leandrew Koyanagi, MD;  Location: [place];  Service: Orthopedics;  Laterality: Left;    There were no vitals filed for this visit.      [facility] PT Assessment - 04/09/18 0001      PROM   Left Ankle Dorsiflexion  6   post session                  [facility] Adult PT Treatment/Exercise - 04/09/18 0001      Ambulation/Gait   Stairs  Yes    Gait Comments  on steps 4 inches 2 rails able to go step over step descending 2 steps  3 X.  Typacally she descends 1 at a time and hip is ER knee locked into hyper extension.   parallel bar pre gait and bent knee wlking drills.        Manual Therapy   Joint Mobilization  AP glide to talus with DF .  Fibula a/P glides.  heel distraction,      Passive ROM  Into DF      Ankle Exercises: Standing   SLS  2 hands needed    Rocker Board  4 minutes   for stretching and calf strengthening,  wood    Other Standing Ankle Exercises  mini  squats max cues,              PT Education - 04/09/18 1148    Education Details  hep/  gait    Person(s) Educated  Patient    Methods  Demonstration;Explanation;Tactile cues;Verbal cues;Handout    Comprehension  Returned demonstration;Verbalized understanding       PT Short Term Goals - 04/07/18 1143      PT SHORT TERM GOAL #1   Title  Pt will demo proper gait pattern within weight bearing restrictions    Baseline  still decr weight and stride but improving      PT SHORT TERM GOAL #2   Title  pt will be independent in use of RICE techniques to keep pain <=4/10 with daily activities    Status  Deferred      PT SHORT TERM GOAL #3   Title  She will be independent with initial HEP    Status  Achieved        PT  Long Term Goals - 04/07/18 1143      PT LONG TERM GOAL #1   Title  FOTO to 37% limited    Baseline  61% limited at eval    Time  10    Period  Weeks    Status  New      PT LONG TERM GOAL #2   Title  Pt will be able to ambulate for at least 1 hour pain <=2/10    Baseline  imcreased time per pt but still no 60 min    Time  10    Period  Weeks    Status  New      PT LONG TERM GOAL #3   Title  DF ROM to +10 passively and +5 actively    Baseline  3 degrees last session    Status  On-going      PT LONG TERM GOAL #4   Title  pt will be able to perform all house, self and child-care activities without limitaiton by ankle    Baseline  doing more due to decr ankle pain but back pain limits    Time  10    Period  Weeks    Status  Partially Met      PT LONG TERM GOAL #5   Title  Pt will demo SLS control for at least 5 s    Baseline  not able    Time  10    Period  Weeks    Status  On-going            Plan - 04/09/18 1301    Clinical Impression Statement  PROM  6 degrees post session.  No pain at end of session.  Mobs helpful  and standing step stretch stretch helpful with ROM.  Patient feels her walking is almost 80 % back to baseline.      PT Next  Visit Plan  Progress ROM and strength  , modalities as needed.  continue manual and review HEP    PT Home Exercise Plan  toe yoga, towel scrunches, seated heel/toe raises, passive DF stretch seated,  standing calf stretch on 2 inch platform. ,  calf stretch on step    Consulted and Agree with Plan of Care  Patient    Family Member Consulted  spouse       Patient will benefit from skilled therapeutic intervention in order to improve the following deficits and impairments:     Visit Diagnosis: Pain in left ankle and joints of left foot  Stiffness of left ankle, not elsewhere classified  Difficulty in walking, not elsewhere classified     Problem List Patient Active Problem List   Diagnosis Date Noted  . Pain in right ankle and joints of right foot 03/06/2018  . Closed displaced trimalleolar fracture of left lower leg 01/17/2018  . Morbid obesity ([place]) 01/17/2018  . Body mass index 50.0-59.9, adult ([place]) 01/17/2018    HARRIS,KAREN PTA 04/09/2018, 1:13 PM  [facility] [address] [place], [place], [address] Phone: [phone]   Fax:  [phone]  Name: Madyson Lukach MRN: [id number] Date of Birth: 07-May-1991

## 2018-04-09 NOTE — Patient Instructions (Addendum)
Stretch on step   Heel Cord Stretch - On Step    Stand with heels over edge of stair. Holding rail, lower heels until stretch is felt in calf of legs. Repeat _3__ times. Do 1___ times per day.  HOLd 30 seconds.  Copyright  VHI. All rights reserved.

## 2018-04-14 ENCOUNTER — Ambulatory Visit: Payer: Self-pay | Attending: Orthopaedic Surgery | Admitting: Physical Therapy

## 2018-04-14 ENCOUNTER — Encounter: Payer: Self-pay | Admitting: Physical Therapy

## 2018-04-14 DIAGNOSIS — M25672 Stiffness of left ankle, not elsewhere classified: Secondary | ICD-10-CM | POA: Insufficient documentation

## 2018-04-14 DIAGNOSIS — M25572 Pain in left ankle and joints of left foot: Secondary | ICD-10-CM | POA: Insufficient documentation

## 2018-04-14 DIAGNOSIS — R262 Difficulty in walking, not elsewhere classified: Secondary | ICD-10-CM | POA: Insufficient documentation

## 2018-04-14 NOTE — Therapy (Signed)
[place], [place], [address] Phone: [phone]   Fax:  [phone]  Physical Therapy Treatment  Patient Details  Name: Stacey Robinson MRN: [id number] Date of Birth: [birth year] Referring Provider (PT): Frankey Shown, MD   Encounter Date: 04/14/2018  PT End of Session - 04/14/18 1803    Visit Number  9    Number of Visits  20    Date for PT Re-Evaluation  05/23/18    Authorization Type  self pay    PT Start Time  1106    PT Stop Time  1145    PT Time Calculation (min)  39 min    Activity Tolerance  Patient tolerated treatment well    Behavior During Therapy  [facility] for tasks assessed/performed       Past Medical History:  Diagnosis Date  . Trimalleolar fracture of ankle, closed    Left    Past Surgical History:  Procedure Laterality Date  . CESAREAN SECTION     x 3  . CHOLECYSTECTOMY     2011  . ORIF ANKLE FRACTURE Left 12/02/2017   Procedure: OPEN REDUCTION INTERNAL FIXATION (ORIF) LEFT TRIMALLEOLAR ANKLE FRACTURE;  Surgeon: Leandrew Koyanagi, MD;  Location: [place];  Service: Orthopedics;  Laterality: Left;    There were no vitals filed for this visit.  Subjective Assessment - 04/14/18 1109    Subjective  Tried going  back to work 12 hour shift.  Painful in back and painful in foot. ( up to 7-8/10 ) She was wearing the brace.  legs were shaking and she thought she was going to collapse.  She is thinking about getting another job wilth less physical requirements.     Currently in Pain?  Yes    Pain Score  2     Pain Location  Ankle    Pain Orientation  Left    Pain Descriptors / Indicators  Aching;Tightness;Throbbing    Pain Type  Chronic pain    Pain Frequency  Intermittent    Aggravating Factors   standing,  work activities    Pain Relieving Factors  sitting,  rest    Pain Score  8   3/10 today  increases at times.   Pain Location  Back    Pain Orientation  Lower    Pain Descriptors /  Indicators  Burning;Pressure    Aggravating Factors   standing    Pain Relieving Factors  rest sitting         [facility] PT Assessment - 04/14/18 0001      AROM   Left Ankle Dorsiflexion  4    Left Ankle Plantar Flexion  42    Left Ankle Inversion  36    Left Ankle Eversion  14      PROM   Left Ankle Dorsiflexion  7                   [facility] Adult PT Treatment/Exercise - 04/14/18 0001      Manual Therapy   Manual Therapy  Passive ROM;Joint mobilization    Joint Mobilization  AP glide to talus with DF .  Fibula a/P glides.  heel distraction,      Passive ROM  Into DF      Ankle Exercises: Supine   Other Supine Ankle Exercises  2 X 10 each DF,IV,EV with manual resistance      Ankle Exercises: Standing   SLS  1 second  looses balanc  Heel Raises Limitations  20 on step.     Other Standing Ankle Exercises  standing with opposite leg swing.  Noted improving foot control.       Ankle Exercises: Stretches   Gastroc Stretch  3 reps;30 seconds   both on step              PT Short Term Goals - 04/14/18 1114      PT SHORT TERM GOAL #1   Title  Pt will demo proper gait pattern within weight bearing restrictions    Baseline  WBAT  able to walk with increased weightbearing.  Standing there are decreased weightbearing times.      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)  ,  does not do to manage pain  uses rest    Time  4    Period  Weeks    Status  Achieved      PT SHORT TERM GOAL #3   Title  She will be independent with initial HEP    Baseline  independent with exercises     Time  4    Period  Weeks    Status  Achieved        PT Long Term Goals - 04/14/18 1806      PT LONG TERM GOAL #1   Title  FOTO to 37% limited    Time  10    Period  Weeks    Status  Unable to assess       PT LONG TERM GOAL #2   Title  Pt will be able to ambulate for at least 1 hour pain <=2/10    Baseline  unable    Time  10    Period  Weeks    Status  On-going      PT LONG TERM GOAL #3   Title  DF ROM to +10 passively and +5 actively    Baseline  7 PROM,  4 AROM    Time  10    Period  Weeks    Status  On-going      PT LONG TERM GOAL #4   Title  pt will be able to perform all house, self and child-care activities without limitaiton by ankle    Baseline  doing more due to decr ankle pain but back pain limits    Time  10    Period  Weeks    Status  Partially Met      PT LONG TERM GOAL #5   Title  Pt will demo SLS control for at least 5 s    Baseline  1 not consistant    Time  10    Period  Weeks    Status  On-going            Plan - 04/14/18 1804    Clinical Impression Statement  ROM  4 degrees AROM DF.  patient was not successful with RTW.  Functional strength improving in clinic.  SLS 1 second at best.     PT Next Visit Plan  Progress ROM and strength  , modalities as needed.  continue manual and progress standing  HEP    PT Home Exercise  Plan  toe yoga, towel scrunches, seated heel/toe raises, passive DF stretch seated,  standing calf stretch on 2 inch platform. ,  calf stretch on step    Consulted and Agree with Plan of Care  Patient       Patient will benefit from skilled therapeutic intervention in order to improve the following deficits and impairments:     Visit Diagnosis: Pain in left ankle and joints of left foot  Stiffness of left ankle, not elsewhere classified  Difficulty in walking, not elsewhere classified     Problem List Patient Active Problem List   Diagnosis Date Noted  . Pain in right ankle and joints of right foot 03/06/2018  . Closed displaced trimalleolar fracture of left lower leg 01/17/2018  . Morbid obesity ([place]) 01/17/2018  . Body mass index 50.0-59.9, adult ([place]) 01/17/2018    Dirk Vanaman PTA 04/14/2018, 6:09 PM  [facility] [address] [place], [place], [address] Phone: [phone]   Fax:  [phone]  Name: Raychel Dowler MRN: [id number] Date of Birth: [birth year]

## 2018-04-15 ENCOUNTER — Ambulatory Visit: Payer: Self-pay

## 2018-04-15 DIAGNOSIS — M25572 Pain in left ankle and joints of left foot: Secondary | ICD-10-CM

## 2018-04-15 DIAGNOSIS — R262 Difficulty in walking, not elsewhere classified: Secondary | ICD-10-CM

## 2018-04-15 DIAGNOSIS — M25672 Stiffness of left ankle, not elsewhere classified: Secondary | ICD-10-CM

## 2018-04-15 NOTE — Therapy (Addendum)
[place], [place], [address] Phone: [phone]   Fax:  [phone]  Physical Therapy Treatment/Discharge  Patient Details  Name: Stacey Robinson MRN: [id number] Date of Birth: [birth year] Referring Provider (PT): Frankey Shown, MD   Encounter Date: 04/15/2018  PT End of Session - 04/15/18 1106    Visit Number  10    Number of Visits  20    Date for PT Re-Evaluation  05/23/18    Authorization Type  self pay    PT Start Time  1102    PT Stop Time  1145    PT Time Calculation (min)  43 min    Activity Tolerance  Patient tolerated treatment well    Behavior During Therapy  [facility] for tasks assessed/performed       Past Medical History:  Diagnosis Date  . Trimalleolar fracture of ankle, closed    Left    Past Surgical History:  Procedure Laterality Date  . CESAREAN SECTION     x 3  . CHOLECYSTECTOMY     2011  . ORIF ANKLE FRACTURE Left 12/02/2017   Procedure: OPEN REDUCTION INTERNAL FIXATION (ORIF) LEFT TRIMALLEOLAR ANKLE FRACTURE;  Surgeon: Leandrew Koyanagi, MD;  Location: [facility];  Service: Orthopedics;  Laterality: Left;    There were no vitals filed for this visit.  Subjective Assessment - 04/15/18 1123    Subjective  Tried work a second time without success due to lifting and time on feet.   She feels unsafe due to legs shaky and feels weak with pain.   Fine the next day.     Patient is accompained by:  Family member    Pain Score  2     Pain Location  Ankle    Pain Orientation  Left    Pain Descriptors / Indicators  Aching;Throbbing    Pain Type  Chronic pain    Pain Onset  More than a month ago    Pain Frequency  Intermittent    Aggravating Factors   activity on feet    Pain Relieving Factors  rest , sit    Pain Score  0    Pain Location  Back    Pain Orientation  Lower    Pain Descriptors / Indicators  Burning;Pressure    Pain Type  Chronic pain    Pain Onset  More than a month ago    Aggravating Factors   stadning    Pain Relieving Factors  sit/rest         [facility] PT Assessment - 04/15/18 0001      Observation/Other Assessments   Focus on Therapeutic Outcomes (FOTO)   52% limited 9 point improvement      AROM   Left Ankle Dorsiflexion  4    Left Ankle Plantar Flexion  42    Left Ankle Inversion  36    Left Ankle Eversion  15      PROM   Left Ankle Dorsiflexion  7                   [facility] Adult PT Treatment/Exercise - 04/15/18 0001      Neuro Re-ed    Neuro Re-ed Details   tandem stand with narrow BOS as tolerated RT and LT foot forward 30 sec x 2 each      Manual Therapy   Joint Mobilization  AP glide to talus with DF .  Fibula a/P glides.  heel distraction,  Passive ROM  Into DF      Ankle Exercises: Stretches   Gastroc Stretch  3 reps;30 seconds      Ankle Exercises: Standing   Rocker Board  --   DF/PF x 30  on board   Other Standing Ankle Exercises  Pf x 20 reps , side stepping RT and Lt 10 FT x 5 each      Ankle Exercises: Seated   Other Seated Ankle Exercises  sit to stand x12               PT Short Term Goals - 04/14/18 1114      PT SHORT TERM GOAL #1   Title  Pt will demo proper gait pattern within weight bearing restrictions    Baseline  WBAT  able to walk with increased weightbearing.  Standing there are decreased weightbearing times.      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)  ,  does not do to manage pain  uses rest    Time  4    Period  Weeks    Status  Achieved      PT SHORT TERM GOAL #3   Title  She will be independent with initial HEP    Baseline  independent with exercises     Time  4    Period  Weeks    Status  Achieved        PT Long Term Goals - 04/14/18 1806      PT LONG TERM GOAL #1   Title  FOTO to 37%  limited    Time  10    Period  Weeks    Status  Unable to assess      PT LONG TERM GOAL #2   Title  Pt will be able to ambulate for at least 1 hour pain <=2/10    Baseline  unable    Time  10    Period  Weeks    Status  On-going      PT LONG TERM GOAL #3   Title  DF ROM to +10 passively and +5 actively    Baseline  7 PROM,  4 AROM    Time  10    Period  Weeks    Status  On-going      PT LONG TERM GOAL #4   Title  pt will be able to perform all house, self and child-care activities without limitaiton by ankle    Baseline  doing more due to decr ankle pain but back pain limits    Time  10    Period  Weeks    Status  Partially Met      PT LONG TERM GOAL #5   Title  Pt will demo SLS control for at least 5 s    Baseline  1 not consistant    Time  10    Period  Weeks    Status  On-going            Plan - 04/15/18 1106    Clinical Impression Statement  Needed 2-3  stops to rest back due to back pain with standing.  She is improved but not to  tolerate extended time on feet as with work tasks. She suggests she may need to get a less physical job.  Her ROM is improved . Gait pattern better but she reports close to pre injury per mother.     Continue per plan.      PT Frequency  2x / week    PT Duration  4 weeks    PT Treatment/Interventions  ADLs/Self Care Home Management;Cryotherapy;Electrical Stimulation;Therapeutic exercise;Therapeutic activities;Moist Heat;Functional mobility training;Stair training;Gait training;Balance training;Neuromuscular re-education;Patient/family education;Manual techniques;Vasopneumatic Device;Taping;Passive range of motion;Scar mobilization    PT Next Visit Plan  Progress ROM and strength  , modalities as needed.  continue manual and progress standing  HEP    PT Home Exercise Plan  toe yoga, towel scrunches, seated heel/toe raises, passive DF stretch seated,  standing calf stretch on 2 inch platform. ,  calf stretch on step    Consulted and Agree  with Plan of Care  Patient    Family Member Consulted  spouse       Patient will benefit from skilled therapeutic intervention in order to improve the following deficits and impairments:  Abnormal gait, Increased edema, Decreased scar mobility, Decreased activity tolerance, Pain, Decreased strength, Difficulty walking, Decreased balance, Decreased mobility, Decreased range of motion, Improper body mechanics, Postural dysfunction, Impaired flexibility  Visit Diagnosis: Pain in left ankle and joints of left foot  Stiffness of left ankle, not elsewhere classified  Difficulty in walking, not elsewhere classified     Problem List Patient Active Problem List   Diagnosis Date Noted  . Pain in right ankle and joints of right foot 03/06/2018  . Closed displaced trimalleolar fracture of left lower leg 01/17/2018  . Morbid obesity ([place]) 01/17/2018  . Body mass index 50.0-59.9, adult ([place]) 01/17/2018    Darrel Hoover  PT 04/15/2018, 11:49 AM  [facility] [address] [place], [place], [address] Phone: [phone]   Fax:  [phone]  Name: Stacey Robinson MRN: [id number] Date of Birth: [birth year]  PHYSICAL THERAPY DISCHARGE SUMMARY  Visits from Start of Care: 10  Current functional level related to goals / functional outcomes: Unknown as she did not return ad did not return to Dr Erlinda Hong.   Remaining deficits: Unknown   Education / Equipment: HEP Plan:                                                    Patient goals were partially met. Patient is being discharged due to not returning since the last visit.  ?????   Pearson Forster PT  05/22/18

## 2018-04-17 ENCOUNTER — Ambulatory Visit (INDEPENDENT_AMBULATORY_CARE_PROVIDER_SITE_OTHER): Payer: Self-pay | Admitting: Orthopaedic Surgery

## 2018-04-25 ENCOUNTER — Encounter

## 2018-11-25 ENCOUNTER — Other Ambulatory Visit: Payer: Self-pay

## 2018-11-25 ENCOUNTER — Ambulatory Visit: Payer: 59 | Attending: Nurse Practitioner | Admitting: Internal Medicine

## 2018-11-25 ENCOUNTER — Encounter: Payer: Self-pay | Admitting: Internal Medicine

## 2018-11-25 DIAGNOSIS — I10 Essential (primary) hypertension: Secondary | ICD-10-CM | POA: Insufficient documentation

## 2018-11-25 DIAGNOSIS — E049 Nontoxic goiter, unspecified: Secondary | ICD-10-CM | POA: Insufficient documentation

## 2018-11-25 DIAGNOSIS — N6310 Unspecified lump in the right breast, unspecified quadrant: Secondary | ICD-10-CM

## 2018-11-25 MED ORDER — LISINOPRIL-HYDROCHLOROTHIAZIDE 10-12.5 MG PO TABS: 1.0000 | ORAL_TABLET | Freq: Every day | ORAL | 3 refills | Status: DC

## 2018-11-25 NOTE — Progress Notes (Signed)
Patient is here to establish care for check up, such as her blood pressure, diabetes, and overall health.

## 2018-11-25 NOTE — Patient Instructions (Signed)
If you do plan to get pregnant in the future, please let me know as we will have to change the blood pressure medication.  Start lisinopril HCTZ for blood pressure.  Try to limit salt in the foods.  I have referred you for a mammogram, thyroid ultrasound and to medical weight loss management.  Follow a Healthy Eating Plan - You can do it! Limit sugary drinks.  Avoid sodas, sweet tea, sport or energy drinks, or fruit drinks.  Drink water, lo-fat milk, or diet drinks. Limit snack foods.   Cut back on candy, cake, cookies, chips, ice cream.  These are a special treat, only in small amounts. Eat plenty of vegetables.  Especially dark green, red, and orange vegetables. Aim for at least 3 servings a day. More is better! Include fruit in your daily diet.  Whole fruit is much healthier than fruit juice! Limit "white" bread, "white" pasta, "white" rice.   Choose "100% whole grain" products, brown or wild rice. Avoid fatty meats. Try "Meatless Monday" and choose eggs or beans one day a week.  When eating meat, choose lean meats like chicken, [place], and fish.  Grill, broil, or bake meats instead of frying, and eat poultry without the skin. Eat less salt.  Avoid frozen pizzas, frozen dinners and salty foods.  Use seasonings other than salt in cooking.  This can help blood pressure and keep you from swelling Beer, wine and liquor have calories.  If you can safely drink alcohol, limit to 1 drink per day for women, 2 drinks for men

## 2018-11-25 NOTE — Progress Notes (Signed)
Patient ID: Stacey Robinson, female    DOB: January 04, 1991  MRN: [id number]  CC: New Patient (Initial Visit)   Subjective: Stacey Robinson is a 28 y.o. female who presents for to establish care and to be screened for diabetes and blood pressure.  She has history of obesity. Her concerns today include:   No previous PCP as an adult.    Obesity: Patient requesting to be screened for diabetes as it runs in her family on both sides.  -She has not had any polyuria or polydipsia.  Always over wgh.  Last year she had set out to lose weight and had loss about 40 lbs but subsequently gained it back after she suffered a fractured ankle that required surgery.  She had a very prolonged recovery course.  She was not able to be as active as she would like as it hurts to stand and walk on the ankle for long periods.  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  She now works from home for [employer]. -Gained 75 lbs over the past yr. it is much more difficult for her to do activities that she used to do as she gets short of breath easily.  She attributes this to the weight gain. -feels she can make healthier food choices like eating less take outs, eating more fresh fruits and vegetables.  Drinks diet sodas and water.   She has never seen a nutritionist in past. -She has renewed her priority to lose weight and try to lead a more healthy lifestyle.    Other concern today is that she feels "hard lumps" in RT breast around the nipple x several wks. + pain at first but not now.  No nipple dischg. Checks breast once a wk.  Felt it last yr in same breast but went away 2 mths later.  Breast CA in paternal aunt.  She is noted to have Elev BP today.  No formal diagnosis. 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  Range 150/90s She tries to limit salt in foods  Other concern is enlarged thyroid on the right side.  She feels that the right anterior  side of the neck has a fullness in it compared to the left.  She has family history of thyroid disease.  Past medical history, social history, family history and surgical history reviewed and updated  Patient Active Problem List   Diagnosis Date Noted  . Pain in right ankle and joints of right foot 03/06/2018  . Closed displaced trimalleolar fracture of left lower leg 01/17/2018  . Morbid obesity ([facility]) 01/17/2018  . Body mass index 50.0-59.9, adult ([facility]) 01/17/2018     Current Outpatient Medications on File Prior to Visit  Medication Sig Dispense Refill  . etonogestrel (NEXPLANON) 68 MG IMPL implant 1 each by Subdermal route once.    Marland Kitchen. acetaminophen (TYLENOL) 650 MG CR tablet Take 650 mg by mouth every 8 (eight) hours as needed for pain.    Marland Kitchen. aspirin EC 81 MG tablet Take 1 tablet (81 mg total) by mouth 2 (two) times daily. (Patient not taking: Reported on 01/28/2018) 84 tablet 0  . calcium-vitamin D (OSCAL WITH D) 500-200 MG-UNIT tablet Take 1 tablet by mouth 3 (three) times daily. (Patient not taking: Reported on 01/28/2018) 90 tablet 12  . methocarbamol (ROBAXIN) 750 MG tablet Take 1 tablet (750 mg total) by mouth 2 (  two) times daily as needed for muscle spasms. (Patient not taking: Reported on 11/25/2018) 60 tablet 0  . ondansetron (ZOFRAN) 4 MG tablet Take 1-2 tablets (4-8 mg total) by mouth every 8 (eight) hours as needed for nausea or vomiting. (Patient not taking: Reported on 01/28/2018) 40 tablet 0  . oxyCODONE-acetaminophen (PERCOCET) 5-325 MG tablet Take 1-2 tablets by mouth every 4 (four) hours as needed for severe pain. (Patient not taking: Reported on 01/28/2018) 30 tablet 0  . potassium chloride (K-DUR,KLOR-CON) 10 MEQ tablet Take 3 tablets (30 mEq total) by mouth 2 (two) times daily. (Patient not taking: Reported on 01/28/2018) 24 tablet 0  . promethazine (PHENERGAN) 25 MG tablet Take 1 tablet (25 mg total) by mouth every 6 (six) hours as needed for nausea. (Patient not taking:  Reported on 01/28/2018) 30 tablet 1  . senna-docusate (SENOKOT S) 8.6-50 MG tablet Take 1 tablet by mouth at bedtime as needed. (Patient not taking: Reported on 01/28/2018) 30 tablet 1  . zinc sulfate 220 (50 Zn) MG capsule Take 1 capsule (220 mg total) by mouth daily. (Patient not taking: Reported on 01/28/2018) 42 capsule 0   No current facility-administered medications on file prior to visit.     Allergies  Allergen Reactions  . Penicillins Hives    Has patient had a PCN reaction causing immediate rash, facial/tongue/throat swelling, SOB or lightheadedness with hypotension: No Has patient had a PCN reaction causing severe rash involving mucus membranes or skin necrosis: No Has patient had a PCN reaction that required hospitalization: No Has patient had a PCN reaction occurring within the last 10 years: No If all of the above answers are "NO", then may proceed with Cephalosporin use.     Social History   Socioeconomic History  . Marital status: Married    Spouse name: Not on file  . Number of children: 3  . Years of education: associates  . Highest education level: Not on file  Occupational History  . Not on file  Social Needs  . Financial resource strain: Not on file  . Food insecurity    Worry: Not on file    Inability: Not on file  . Transportation needs    Medical: Not on file    Non-medical: Not on file  Tobacco Use  . Smoking status: Never Smoker  . Smokeless tobacco: Never Used  Substance and Sexual Activity  . Alcohol use: Never    Frequency: Never  . Drug use: Never  . Sexual activity: Yes    Birth control/protection: Implant    Comment: Expired implant in arm   Lifestyle  . Physical activity    Days per week: Not on file    Minutes per session: Not on file  . Stress: Not on file  Relationships  . Social Musicianconnections    Talks on phone: Not on file    Gets together: Not on file    Attends religious service: Not on file    Active member of club or  organization: Not on file    Attends meetings of clubs or organizations: Not on file    Relationship status: Not on file  . Intimate partner violence    Fear of current or ex partner: Not on file    Emotionally abused: Not on file    Physically abused: Not on file    Forced sexual activity: Not on file  Other Topics Concern  . Not on file  Social History Narrative  . Not on file  Family History  Problem Relation Age of Onset  . Diabetes Mother   . Hypertension Mother   . Hypertension Father   . Breast cancer Paternal Aunt   . Diabetes Paternal Aunt   . Diabetes Paternal Uncle     Past Surgical History:  Procedure Laterality Date  . CESAREAN SECTION     x 3  . CHOLECYSTECTOMY     2011  . ORIF ANKLE FRACTURE Left 12/02/2017   Procedure: OPEN REDUCTION INTERNAL FIXATION (ORIF) LEFT TRIMALLEOLAR ANKLE FRACTURE;  Surgeon: Tarry KosXu, Naiping M, MD;  Location: [facility];  Service: Orthopedics;  Laterality: Left;    ROS: Review of Systems Negative except as stated above  PHYSICAL EXAM: BP (!) 159/104 (BP Location: Right Arm, Patient Position: Sitting, Cuff Size: Large) Comment (Cuff Size): thigh  Pulse (!) 105   Temp 98.7 F (37.1 C) (Oral)   Ht 5\' 4"  (1.626 m)   Wt (!) 435 lb (197.3 kg)   SpO2 (!) 16%   BMI 74.67 kg/m   Physical Exam bp 154/100 General appearance - alert, well appearing, morbidly obese female and in no distress Mental status - normal mood, behavior, speech, dress, motor activity, and thought processes Eyes - pupils equal and reactive, extraocular eye movements intact Nose - normal and patent, no erythema, discharge or polyps Mouth - mucous membranes moist, pharynx normal without lesions Neck - supple, no significant adenopathy.  Thyroid gland mildly enlarged on the right side.  No thyroid nodules appreciated. Chest - clear to auscultation, no wheezes, rales or rhonchi, symmetric air entry Heart - normal rate, regular rhythm, normal S1, S2, no murmurs, rubs,  clicks or gallops Breasts -CMA Julius Bowelsollock is present.  Breasts are pendulous.  No axillary lymphadenopathy.  No abnormal mass appreciated in either breast.  No nipple discharge expressed.  I asked the patient to put my hand on the area where she feels the lumps around the right nipple.  I was not able to appreciate what she felt. Extremities -no lower extremity edema  A1C today:  5.4 CMP Latest Ref Rng & Units 11/28/2017  Glucose 65 - 99 mg/dL 88  BUN 6 - 20 mg/dL 7  Creatinine 1.610.44 - 0.961.00 mg/dL 0.450.70  Sodium 409135 - 811145 mmol/L 141  Potassium 3.5 - 5.1 mmol/L 3.2(L)  Chloride 101 - 111 mmol/L 109  CO2 22 - 32 mmol/L 25  Calcium 8.9 - 10.3 mg/dL 8.9   Lipid Panel  No results found for: CHOL, TRIG, HDL, CHOLHDL, VLDL, LDLCALC, LDLDIRECT  CBC    Component Value Date/Time   WBC 7.5 11/28/2017 1016   RBC 4.59 11/28/2017 1016   HGB 13.3 11/28/2017 1016   HCT 42.4 11/28/2017 1016   PLT 265 11/28/2017 1016   MCV 92.4 11/28/2017 1016   MCH 29.0 11/28/2017 1016   MCHC 31.4 11/28/2017 1016   RDW 13.5 11/28/2017 1016    ASSESSMENT AND PLAN: 1. Morbid obesity ([facility]) Commended her on wanting to make lifestyle change and I went over ways we can help her do that. Discussed the importance of healthy eating habits.  Advised to cut out all sugary drinks.  Advised to cut back on white carbohydrates.  Encouraged to eat more white meat than red meat.  Encouraged to incorporate fresh fruits and vegetables into the diet.  Printed information given -Encouraged her to start walking if only for 10 minutes every day. -She is agreeable to referral to medical weight management - Amb Ref to Medical Weight Management  2. Essential  hypertension Given that blood pressure readings have been elevated since last year based on her report.  I recommend starting lisinopril/HCTZ.  Patient has birth control implant in the upper arm.  She is not planning on getting pregnant in the near future.  I told her that if she does she  needs to let me know so that we can change the blood pressure medication as ACE inhibitor is contraindicated in pregnancy.  She expressed understanding. -Encourage her to cut back on salt in the foods. - CBC - Comprehensive metabolic panel - Lipid panel - lisinopril-hydrochlorothiazide (ZESTORETIC) 10-12.5 MG tablet; Take 1 tablet by mouth daily.  Dispense: 30 tablet; Refill: 3  3. Thyroid enlargement - US Soft Tissue Head/Neck; Future - TSH+T4F+T3Free  4. Breast mass, right - MM Digital Diagnostic Unilat R; Future  Patient was given the opportunity to ask questions.  Patient verbalized understanding of the plan and was able to repeat key elements of the plan.   Orders Placed This Encounter  Procedures  . MM Digital Diagnostic Unilat R  . US Soft Tissue Head/Neck  . CBC  . Comprehensive metabolic panel  . Lipid panel  . TSH+T4F+T3Free  . Amb Ref to Medical Weight Management     Requested Prescriptions   Signed Prescriptions Disp Refills  . lisinopril-hydrochlorothiazide (ZESTORETIC) 10-12.5 MG tablet 30 tablet 3    Sig: Take 1 tablet by mouth daily.    Return in about 6 weeks (around 01/06/2019) for PAP.  Karle Plumber, MD, FACP

## 2018-11-26 LAB — COMPREHENSIVE METABOLIC PANEL
ALT: 51 IU/L — ABNORMAL HIGH (ref 0–32)
AST: 32 IU/L (ref 0–40)
Albumin/Globulin Ratio: 1.5 (ref 1.2–2.2)
Albumin: 4.6 g/dL (ref 3.9–5.0)
Alkaline Phosphatase: 72 IU/L (ref 39–117)
BUN/Creatinine Ratio: 10 (ref 9–23)
BUN: 8 mg/dL (ref 6–20)
Bilirubin Total: 1 mg/dL (ref 0.0–1.2)
CO2: 26 mmol/L (ref 20–29)
Calcium: 9.9 mg/dL (ref 8.7–10.2)
Chloride: 101 mmol/L (ref 96–106)
Creatinine, Ser: 0.84 mg/dL (ref 0.57–1.00)
GFR calc Af Amer: 108 mL/min/{1.73_m2} (ref >59)
GFR calc non Af Amer: 94 mL/min/{1.73_m2} (ref >59)
Globulin, Total: 3 g/dL (ref 1.5–4.5)
Glucose: 89 mg/dL (ref 65–99)
Potassium: 4.6 mmol/L (ref 3.5–5.2)
Sodium: 139 mmol/L (ref 134–144)
Total Protein: 7.6 g/dL (ref 6.0–8.5)

## 2018-11-26 LAB — LIPID PANEL
Chol/HDL Ratio: 2 ratio (ref 0.0–4.4)
Cholesterol, Total: 134 mg/dL (ref 100–199)
HDL: 66 mg/dL (ref >39)
LDL Calculated: 53 mg/dL (ref 0–99)
Triglycerides: 75 mg/dL (ref 0–149)
VLDL Cholesterol Cal: 15 mg/dL (ref 5–40)

## 2018-11-26 LAB — CBC
Hematocrit: 44 % (ref 34.0–46.6)
Hemoglobin: 14.3 g/dL (ref 11.1–15.9)
MCH: 28.1 pg (ref 26.6–33.0)
MCHC: 32.5 g/dL (ref 31.5–35.7)
MCV: 87 fL (ref 79–97)
Platelets: 324 10*3/uL (ref 150–450)
RBC: 5.08 x10E6/uL (ref 3.77–5.28)
RDW: 14.7 % (ref 11.7–15.4)
WBC: 8.2 10*3/uL (ref 3.4–10.8)

## 2018-11-26 LAB — TSH+T4F+T3FREE
Free T4: 1.4 ng/dL (ref 0.82–1.77)
T3, Free: 3.7 pg/mL (ref 2.0–4.4)
TSH: 1.37 u[IU]/mL (ref 0.450–4.500)

## 2018-11-28 ENCOUNTER — Ambulatory Visit (HOSPITAL_COMMUNITY): Admission: RE | Admit: 2018-11-28 | Payer: 59 | Source: Ambulatory Visit

## 2018-12-01 ENCOUNTER — Telehealth: Payer: Self-pay

## 2018-12-01 NOTE — Telephone Encounter (Signed)
Contacted pt to go over lab results pt didn't answer left a detailed vm informing pt of results and if she has any questions or concerns to give me a call  

## 2018-12-02 ENCOUNTER — Other Ambulatory Visit: Payer: Self-pay | Admitting: Internal Medicine

## 2018-12-02 ENCOUNTER — Ambulatory Visit: Payer: Self-pay | Admitting: Nurse Practitioner

## 2018-12-02 DIAGNOSIS — N6310 Unspecified lump in the right breast, unspecified quadrant: Secondary | ICD-10-CM

## 2018-12-16 ENCOUNTER — Other Ambulatory Visit: Payer: 59

## 2018-12-16 ENCOUNTER — Inpatient Hospital Stay: Admission: RE | Admit: 2018-12-16 | Payer: 59 | Source: Ambulatory Visit

## 2019-01-06 ENCOUNTER — Ambulatory Visit: Payer: 59 | Admitting: Internal Medicine

## 2019-03-31 ENCOUNTER — Ambulatory Visit (INDEPENDENT_AMBULATORY_CARE_PROVIDER_SITE_OTHER): Payer: Self-pay | Admitting: Family Medicine

## 2019-04-14 ENCOUNTER — Ambulatory Visit (INDEPENDENT_AMBULATORY_CARE_PROVIDER_SITE_OTHER): Payer: Self-pay | Admitting: Family Medicine

## 2019-04-28 ENCOUNTER — Ambulatory Visit (INDEPENDENT_AMBULATORY_CARE_PROVIDER_SITE_OTHER): Payer: Self-pay | Admitting: Family Medicine

## 2019-10-11 NOTE — Progress Notes (Signed)
Patient did not show for appointment.   

## 2019-10-13 ENCOUNTER — Ambulatory Visit (HOSPITAL_BASED_OUTPATIENT_CLINIC_OR_DEPARTMENT_OTHER): Payer: 59 | Admitting: Family

## 2019-10-13 DIAGNOSIS — Z5329 Procedure and treatment not carried out because of patient's decision for other reasons: Secondary | ICD-10-CM

## 2022-10-15 LAB — OB RESULTS CONSOLE HIV ANTIBODY (ROUTINE TESTING): HIV: NONREACTIVE

## 2022-10-15 LAB — OB RESULTS CONSOLE RPR: RPR: NONREACTIVE

## 2022-10-15 LAB — OB RESULTS CONSOLE GC/CHLAMYDIA
Chlamydia: NEGATIVE
Neisseria Gonorrhea: NEGATIVE

## 2022-10-15 LAB — OB RESULTS CONSOLE RUBELLA ANTIBODY, IGM: Rubella: IMMUNE

## 2022-10-15 LAB — OB RESULTS CONSOLE HEPATITIS B SURFACE ANTIGEN: Hepatitis B Surface Ag: NEGATIVE

## 2022-10-22 ENCOUNTER — Telehealth: Payer: Self-pay

## 2022-10-22 ENCOUNTER — Other Ambulatory Visit: Payer: Self-pay | Admitting: Obstetrics and Gynecology

## 2022-10-22 DIAGNOSIS — O0990 Supervision of high risk pregnancy, unspecified, unspecified trimester: Secondary | ICD-10-CM

## 2022-10-30 ENCOUNTER — Other Ambulatory Visit: Payer: Self-pay

## 2022-11-29 DIAGNOSIS — Z98891 History of uterine scar from previous surgery: Secondary | ICD-10-CM | POA: Insufficient documentation

## 2022-11-29 DIAGNOSIS — Z903 Acquired absence of stomach [part of]: Secondary | ICD-10-CM | POA: Insufficient documentation

## 2022-12-05 ENCOUNTER — Ambulatory Visit: Payer: BLUE CROSS/BLUE SHIELD | Attending: Obstetrics and Gynecology

## 2022-12-05 ENCOUNTER — Ambulatory Visit: Payer: 59

## 2022-12-05 ENCOUNTER — Ambulatory Visit: Payer: BLUE CROSS/BLUE SHIELD

## 2022-12-05 DIAGNOSIS — Z903 Acquired absence of stomach [part of]: Secondary | ICD-10-CM

## 2022-12-05 DIAGNOSIS — Z98891 History of uterine scar from previous surgery: Secondary | ICD-10-CM

## 2023-01-04 ENCOUNTER — Ambulatory Visit: Payer: BLUE CROSS/BLUE SHIELD | Attending: Obstetrics and Gynecology

## 2023-01-04 ENCOUNTER — Ambulatory Visit: Payer: BLUE CROSS/BLUE SHIELD | Admitting: *Deleted

## 2023-01-04 ENCOUNTER — Encounter: Payer: Self-pay | Admitting: *Deleted

## 2023-01-04 ENCOUNTER — Other Ambulatory Visit: Payer: Self-pay | Admitting: *Deleted

## 2023-01-04 VITALS — BP 136/82 | HR 77

## 2023-01-04 DIAGNOSIS — O34219 Maternal care for unspecified type scar from previous cesarean delivery: Secondary | ICD-10-CM | POA: Diagnosis not present

## 2023-01-04 DIAGNOSIS — O0990 Supervision of high risk pregnancy, unspecified, unspecified trimester: Secondary | ICD-10-CM | POA: Diagnosis present

## 2023-01-04 DIAGNOSIS — E049 Nontoxic goiter, unspecified: Secondary | ICD-10-CM

## 2023-01-04 DIAGNOSIS — Z3A23 23 weeks gestation of pregnancy: Secondary | ICD-10-CM

## 2023-01-04 DIAGNOSIS — O99282 Endocrine, nutritional and metabolic diseases complicating pregnancy, second trimester: Secondary | ICD-10-CM | POA: Diagnosis not present

## 2023-01-04 DIAGNOSIS — O99212 Obesity complicating pregnancy, second trimester: Secondary | ICD-10-CM | POA: Diagnosis present

## 2023-01-04 DIAGNOSIS — E669 Obesity, unspecified: Secondary | ICD-10-CM

## 2023-01-04 DIAGNOSIS — O99842 Bariatric surgery status complicating pregnancy, second trimester: Secondary | ICD-10-CM

## 2023-01-04 DIAGNOSIS — O10012 Pre-existing essential hypertension complicating pregnancy, second trimester: Secondary | ICD-10-CM

## 2023-02-01 ENCOUNTER — Ambulatory Visit: Payer: BC Managed Care – PPO | Attending: Obstetrics and Gynecology

## 2023-02-01 ENCOUNTER — Other Ambulatory Visit: Payer: Self-pay | Admitting: *Deleted

## 2023-02-01 ENCOUNTER — Ambulatory Visit: Payer: BC Managed Care – PPO | Admitting: *Deleted

## 2023-02-01 VITALS — BP 132/70 | HR 75

## 2023-02-01 DIAGNOSIS — Z3A27 27 weeks gestation of pregnancy: Secondary | ICD-10-CM

## 2023-02-01 DIAGNOSIS — O34219 Maternal care for unspecified type scar from previous cesarean delivery: Secondary | ICD-10-CM

## 2023-02-01 DIAGNOSIS — E049 Nontoxic goiter, unspecified: Secondary | ICD-10-CM

## 2023-02-01 DIAGNOSIS — O99212 Obesity complicating pregnancy, second trimester: Secondary | ICD-10-CM

## 2023-02-01 DIAGNOSIS — O99842 Bariatric surgery status complicating pregnancy, second trimester: Secondary | ICD-10-CM

## 2023-02-01 DIAGNOSIS — O99282 Endocrine, nutritional and metabolic diseases complicating pregnancy, second trimester: Secondary | ICD-10-CM

## 2023-02-01 DIAGNOSIS — E669 Obesity, unspecified: Secondary | ICD-10-CM

## 2023-02-01 DIAGNOSIS — O10012 Pre-existing essential hypertension complicating pregnancy, second trimester: Secondary | ICD-10-CM | POA: Diagnosis not present

## 2023-02-01 DIAGNOSIS — Z98891 History of uterine scar from previous surgery: Secondary | ICD-10-CM

## 2023-02-12 ENCOUNTER — Other Ambulatory Visit: Payer: Self-pay | Admitting: Obstetrics and Gynecology

## 2023-02-12 DIAGNOSIS — O34211 Maternal care for low transverse scar from previous cesarean delivery: Secondary | ICD-10-CM

## 2023-03-08 ENCOUNTER — Ambulatory Visit: Payer: BC Managed Care – PPO | Attending: Obstetrics

## 2023-03-08 ENCOUNTER — Ambulatory Visit: Payer: BC Managed Care – PPO | Admitting: *Deleted

## 2023-03-08 ENCOUNTER — Other Ambulatory Visit: Payer: Self-pay | Admitting: *Deleted

## 2023-03-08 VITALS — BP 143/75 | HR 75

## 2023-03-08 DIAGNOSIS — O99213 Obesity complicating pregnancy, third trimester: Secondary | ICD-10-CM

## 2023-03-08 DIAGNOSIS — E049 Nontoxic goiter, unspecified: Secondary | ICD-10-CM

## 2023-03-08 DIAGNOSIS — O99283 Endocrine, nutritional and metabolic diseases complicating pregnancy, third trimester: Secondary | ICD-10-CM | POA: Diagnosis not present

## 2023-03-08 DIAGNOSIS — E669 Obesity, unspecified: Secondary | ICD-10-CM

## 2023-03-08 DIAGNOSIS — O10013 Pre-existing essential hypertension complicating pregnancy, third trimester: Secondary | ICD-10-CM

## 2023-03-08 DIAGNOSIS — O99212 Obesity complicating pregnancy, second trimester: Secondary | ICD-10-CM | POA: Insufficient documentation

## 2023-03-08 DIAGNOSIS — Z98891 History of uterine scar from previous surgery: Secondary | ICD-10-CM | POA: Insufficient documentation

## 2023-03-08 DIAGNOSIS — O99843 Bariatric surgery status complicating pregnancy, third trimester: Secondary | ICD-10-CM | POA: Diagnosis not present

## 2023-03-08 DIAGNOSIS — Z3A32 32 weeks gestation of pregnancy: Secondary | ICD-10-CM

## 2023-03-08 DIAGNOSIS — O10012 Pre-existing essential hypertension complicating pregnancy, second trimester: Secondary | ICD-10-CM | POA: Diagnosis present

## 2023-03-08 DIAGNOSIS — Z903 Acquired absence of stomach [part of]: Secondary | ICD-10-CM

## 2023-03-08 DIAGNOSIS — O34219 Maternal care for unspecified type scar from previous cesarean delivery: Secondary | ICD-10-CM | POA: Insufficient documentation

## 2023-03-08 DIAGNOSIS — O10913 Unspecified pre-existing hypertension complicating pregnancy, third trimester: Secondary | ICD-10-CM

## 2023-03-30 ENCOUNTER — Inpatient Hospital Stay (HOSPITAL_COMMUNITY)
Admission: AD | Admit: 2023-03-30 | Discharge: 2023-03-30 | Disposition: A | Discharge status: Home / Self Care | Payer: BC Managed Care – PPO | Attending: Obstetrics and Gynecology | Admitting: Obstetrics and Gynecology

## 2023-03-30 ENCOUNTER — Other Ambulatory Visit: Payer: Self-pay

## 2023-03-30 ENCOUNTER — Encounter (HOSPITAL_COMMUNITY): Payer: Self-pay | Admitting: Obstetrics and Gynecology

## 2023-03-30 DIAGNOSIS — O368130 Decreased fetal movements, third trimester, not applicable or unspecified: Secondary | ICD-10-CM | POA: Diagnosis present

## 2023-03-30 DIAGNOSIS — Z3A35 35 weeks gestation of pregnancy: Secondary | ICD-10-CM | POA: Insufficient documentation

## 2023-03-30 DIAGNOSIS — O10913 Unspecified pre-existing hypertension complicating pregnancy, third trimester: Secondary | ICD-10-CM | POA: Insufficient documentation

## 2023-03-30 LAB — COMPREHENSIVE METABOLIC PANEL
ALT: 14 U/L (ref 0–44)
AST: 14 U/L — ABNORMAL LOW (ref 15–41)
Albumin: 2.7 g/dL — ABNORMAL LOW (ref 3.5–5.0)
Alkaline Phosphatase: 80 U/L (ref 38–126)
Anion gap: 12 (ref 5–15)
BUN: 6 mg/dL (ref 6–20)
CO2: 21 mmol/L — ABNORMAL LOW (ref 22–32)
Calcium: 9.3 mg/dL (ref 8.9–10.3)
Chloride: 104 mmol/L (ref 98–111)
Creatinine, Ser: 0.49 mg/dL (ref 0.44–1.00)
GFR, Estimated: >60 mL/min (ref >60)
Glucose, Bld: 84 mg/dL (ref 70–99)
Potassium: 3.6 mmol/L (ref 3.5–5.1)
Sodium: 137 mmol/L (ref 135–145)
Total Bilirubin: 0.8 mg/dL (ref 0.3–1.2)
Total Protein: 6.3 g/dL — ABNORMAL LOW (ref 6.5–8.1)

## 2023-03-30 LAB — URINALYSIS, ROUTINE W REFLEX MICROSCOPIC
Bilirubin Urine: NEGATIVE
Glucose, UA: NEGATIVE mg/dL
Hgb urine dipstick: NEGATIVE
Ketones, ur: NEGATIVE mg/dL
Leukocytes,Ua: NEGATIVE
Nitrite: NEGATIVE
Protein, ur: NEGATIVE mg/dL
Specific Gravity, Urine: 1.01 (ref 1.005–1.030)
pH: 7 (ref 5.0–8.0)

## 2023-03-30 LAB — CBC
HCT: 37.6 % (ref 36.0–46.0)
Hemoglobin: 12.4 g/dL (ref 12.0–15.0)
MCH: 30.9 pg (ref 26.0–34.0)
MCHC: 33 g/dL (ref 30.0–36.0)
MCV: 93.8 fL (ref 80.0–100.0)
Platelets: 192 10*3/uL (ref 150–400)
RBC: 4.01 MIL/uL (ref 3.87–5.11)
RDW: 13.8 % (ref 11.5–15.5)
WBC: 8.7 10*3/uL (ref 4.0–10.5)
nRBC: 0 % (ref 0.0–0.2)

## 2023-03-30 LAB — PROTEIN / CREATININE RATIO, URINE
Creatinine, Urine: 62 mg/dL
Protein Creatinine Ratio: 0.13 mg/mg{creat} (ref 0.00–0.15)
Total Protein, Urine: 8 mg/dL

## 2023-03-30 MED ORDER — LABETALOL HCL 5 MG/ML IV SOLN: 80.0000 mg | INTRAVENOUS | Status: DC | PRN

## 2023-03-30 MED ORDER — LABETALOL HCL 5 MG/ML IV SOLN
40.0000 mg | INTRAVENOUS | Status: DC | PRN
  Administered 2023-03-30: 40 mg via INTRAVENOUS
  Filled 2023-03-30: qty 8

## 2023-03-30 MED ORDER — HYDRALAZINE HCL 20 MG/ML IJ SOLN: 10.0000 mg | INTRAMUSCULAR | Status: DC | PRN

## 2023-03-30 MED ORDER — ACETAMINOPHEN-CAFFEINE 500-65 MG PO TABS
2.0000 | ORAL_TABLET | Freq: Once | ORAL | Status: AC
  Administered 2023-03-30: 2 via ORAL
  Filled 2023-03-30: qty 2

## 2023-03-30 MED ORDER — LABETALOL HCL 5 MG/ML IV SOLN
20.0000 mg | INTRAVENOUS | Status: DC | PRN
  Administered 2023-03-30: 20 mg via INTRAVENOUS
  Filled 2023-03-30: qty 4

## 2023-03-30 MED ORDER — NIFEDIPINE ER 30 MG PO TB24: 30.0000 mg | ORAL_TABLET | Freq: Every day | ORAL | 2 refills | Status: DC

## 2023-03-30 MED ORDER — NIFEDIPINE ER OSMOTIC RELEASE 30 MG PO TB24
30.0000 mg | ORAL_TABLET | Freq: Every day | ORAL | Status: DC
  Administered 2023-03-30: 30 mg via ORAL
  Filled 2023-03-30: qty 1

## 2023-03-30 NOTE — MAU Note (Signed)
..  Stacey Robinson is a 32 y.o. at [redacted]w[redacted]d here in MAU reporting: Not feeling baby move as strong as she usually does. She felt baby move about 20 mins. ago. She also took her BP this morning and it was in the 160's. Also has a headaches since around 0800. Denies vaginal bleeding or abnormal discharge  Onset of complaint: 03/30/2023 Pain score: 4/10 Vitals:   03/30/23 1109  BP: (!) 153/94  Pulse: 92  Resp: 16  Temp: 98.4 F (36.9 C)  SpO2: 98%     FHT:150 Lab orders placed from triage:   UA

## 2023-03-30 NOTE — MAU Provider Note (Signed)
History     CSN: [id number]  Arrival date and time: 03/30/23 1053   Event Date/Time   First Provider Initiated Contact with Patient 03/30/23 1238      Chief Complaint  Patient presents with   Decreased Fetal Movement   Headache   Hypertension   HPI [name] is a 32 y.o. year old [id number] female at [redacted]w[redacted]d weeks gestation who presents to MAU reporting DFM, BPs of 160s at home and H/A since 0800 (rated 4/10). She denies VB,abnormal vaginal discharge or LOF. Her pregnancy is complicated by [facility] on no meds since being pregnant. She was on antihypertensives prior to pregnancy, but was taken off them once she got pregnant. She has a h/o C/S; scheduled for RCS and BTL on 04/24/2023. She receives [facility] with [facility]; next appt is 04/01/2023.    OB History     Gravida  4   Para  3   Term  3   Preterm      AB      Living  3      SAB      IAB      Ectopic      Multiple      Live Births  3           Past Medical History:  Diagnosis Date   Closed displaced trimalleolar fracture of left lower leg 01/17/2018   Enlarged thyroid    Pain in right ankle and joints of right foot 03/06/2018   Trimalleolar fracture of ankle, closed    Left   Vitamin D deficiency     Past Surgical History:  Procedure Laterality Date   CESAREAN SECTION     x 3   CHOLECYSTECTOMY     2011   GASTRECTOMY     ORIF ANKLE FRACTURE Left 12/02/2017   Procedure: OPEN REDUCTION INTERNAL FIXATION (ORIF) LEFT TRIMALLEOLAR ANKLE FRACTURE;  Surgeon: Tarry Kos, MD;  Location: [facility] OR;  Service: Orthopedics;  Laterality: Left;    Family History  Problem Relation Age of Onset   Diabetes Mother    Hypertension Mother    Hypertension Father    Breast cancer Paternal Aunt    Diabetes Paternal Aunt    Diabetes Paternal Uncle     Social History   Tobacco Use   Smoking status: Never   Smokeless tobacco: Never  Vaping Use   Vaping status: Never Used  Substance Use Topics    Alcohol use: Never   Drug use: Never    Allergies:  Allergies  Allergen Reactions   Penicillins Hives    Has patient had a PCN reaction causing immediate rash, facial/tongue/throat swelling, SOB or lightheadedness with hypotension: No Has patient had a PCN reaction causing severe rash involving mucus membranes or skin necrosis: No Has patient had a PCN reaction that required hospitalization: No Has patient had a PCN reaction occurring within the last 10 years: No If all of the above answers are "NO", then may proceed with Cephalosporin use.     No medications prior to admission.    Review of Systems  Constitutional: Negative.   HENT: Negative.    Eyes: Negative.   Respiratory: Negative.    Cardiovascular: Negative.   Gastrointestinal: Negative.   Endocrine: Negative.   Genitourinary:        DFM  Musculoskeletal: Negative.   Skin: Negative.   Allergic/Immunologic: Negative.   Neurological:  Positive for headaches.  Hematological: Negative.   Psychiatric/Behavioral: Negative.  Physical Exam  Patient Vitals for the past 24 hrs:  BP Temp Temp src Pulse Resp SpO2  03/30/23 1646 129/78 -- -- 81 -- --  03/30/23 1631 128/70 -- -- 79 -- --  03/30/23 1616 127/70 -- -- 78 -- --  03/30/23 1601 133/77 -- -- 83 -- --  03/30/23 1546 (!) 145/77 -- -- 81 -- --  03/30/23 1531 (!) 157/86 -- -- 78 -- --  03/30/23 1516 (!) 157/88 -- -- 80 -- --  03/30/23 1501 (!) 155/86 -- -- 77 -- --  03/30/23 1446 (!) 156/84 -- -- 76 -- --  03/30/23 1431 (!) 154/81 -- -- 76 -- --  03/30/23 1416 (!) 155/83 -- -- 77 -- --  03/30/23 1401 (!) 164/90 -- -- 76 -- --  03/30/23 1346 (!) 157/86 -- -- 75 -- --  03/30/23 1331 (!) 160/83 -- -- 76 -- --  03/30/23 1316 (!) 158/85 -- -- 76 -- --  03/30/23 1301 (!) 163/88 -- -- 76 -- --  03/30/23 1246 (!) 166/92 -- -- 76 -- --  03/30/23 1231 (!) 157/92 -- -- 74 -- --  03/30/23 1216 (!) 141/86 -- -- 82 -- --  03/30/23 1201 (!) 156/92 -- -- 84 -- --   03/30/23 1146 (!) 155/94 -- -- 93 -- --  03/30/23 1131 (!) 159/91 -- -- 81 -- --  03/30/23 1130 -- -- -- -- -- 99 %  03/30/23 1120 -- -- -- -- -- 99 %  03/30/23 1119 (!) 172/105 -- -- 90 -- --  03/30/23 1116 (!) 164/138 -- -- 88 -- --  03/30/23 1115 -- -- -- -- -- 98 %  03/30/23 1110 (!) 153/94 -- -- 95 -- --  03/30/23 1109 (!) 153/94 98.4 F (36.9 C) Oral 92 16 98 %    Physical Exam Vitals and nursing note reviewed.  Constitutional:      Appearance: Normal appearance. She is obese.  HENT:     Head: Normocephalic and atraumatic.  Cardiovascular:     Rate and Rhythm: Normal rate and regular rhythm.     Heart sounds: Normal heart sounds.  Pulmonary:     Effort: Pulmonary effort is normal.     Breath sounds: Normal breath sounds.  Abdominal:     General: Bowel sounds are normal.     Palpations: Abdomen is soft.  Genitourinary:    Comments: deferred Musculoskeletal:        General: Normal range of motion.  Skin:    General: Skin is warm and dry.  Neurological:     Mental Status: She is alert and oriented to person, place, and time.  Psychiatric:        Mood and Affect: Mood normal.        Behavior: Behavior normal.        Thought Content: Thought content normal.        Judgment: Judgment normal.    REACTIVE NST - FHR: 140 bpm / moderate variability / accels present / decels absent / TOCO: none  MAU Course  Procedures  MDM CCUA CBC CMP P/C Ratio Serial BP's  Excedrin Tension H/A 2 caplets -- H/A relieved Labetalol Protocol Procardia XR 30 mg po   Results for orders placed or performed during the hospital encounter of 03/30/23 (from the past 24 hour(s))  Urinalysis, Routine w reflex microscopic -Urine, Clean Catch     Status: Abnormal   Collection Time: 03/30/23 11:26 AM  Result Value Ref Range   Color, Urine  YELLOW YELLOW   APPearance HAZY (A) CLEAR   Specific Gravity, Urine 1.010 1.005 - 1.030   pH 7.0 5.0 - 8.0   Glucose, UA NEGATIVE NEGATIVE mg/dL    Hgb urine dipstick NEGATIVE NEGATIVE   Bilirubin Urine NEGATIVE NEGATIVE   Ketones, ur NEGATIVE NEGATIVE mg/dL   Protein, ur NEGATIVE NEGATIVE mg/dL   Nitrite NEGATIVE NEGATIVE   Leukocytes,Ua NEGATIVE NEGATIVE   RBC / HPF 0-5 0 - 5 RBC/hpf   WBC, UA 0-5 0 - 5 WBC/hpf   Bacteria, UA MANY (A) NONE SEEN   Squamous Epithelial / HPF 6-10 0 - 5 /HPF   Mucus PRESENT   Protein / creatinine ratio, urine     Status: None   Collection Time: 03/30/23 11:26 AM  Result Value Ref Range   Creatinine, Urine 62 mg/dL   Total Protein, Urine 8 mg/dL   Protein Creatinine Ratio 0.13 0.00 - 0.15 mg/mg[Cre]  Comprehensive metabolic panel     Status: Abnormal   Collection Time: 03/30/23 11:43 AM  Result Value Ref Range   Sodium 137 135 - 145 mmol/L   Potassium 3.6 3.5 - 5.1 mmol/L   Chloride 104 98 - 111 mmol/L   CO2 21 (L) 22 - 32 mmol/L   Glucose, Bld 84 70 - 99 mg/dL   BUN 6 6 - 20 mg/dL   Creatinine, Ser 8.29 0.44 - 1.00 mg/dL   Calcium 9.3 8.9 - 56.2 mg/dL   Total Protein 6.3 (L) 6.5 - 8.1 g/dL   Albumin 2.7 (L) 3.5 - 5.0 g/dL   AST 14 (L) 15 - 41 U/L   ALT 14 0 - 44 U/L   Alkaline Phosphatase 80 38 - 126 U/L   Total Bilirubin 0.8 0.3 - 1.2 mg/dL   GFR, Estimated >13 >08 mL/min   Anion gap 12 5 - 15  CBC     Status: None   Collection Time: 03/30/23 11:43 AM  Result Value Ref Range   WBC 8.7 4.0 - 10.5 K/uL   RBC 4.01 3.87 - 5.11 MIL/uL   Hemoglobin 12.4 12.0 - 15.0 g/dL   HCT 65.7 84.6 - 96.2 %   MCV 93.8 80.0 - 100.0 fL   MCH 30.9 26.0 - 34.0 pg   MCHC 33.0 30.0 - 36.0 g/dL   RDW 95.2 84.1 - 32.4 %   Platelets 192 150 - 400 K/uL   nRBC 0.0 0.0 - 0.2 %    *Consult with Dr. Earlene Plater @ 1300 - notified of patient's complaints, assessments, lab & NST results, tx plan Rx Procardia XR 30 mg po daily and f/u Monday in the office - ok to d/c home, agrees with plan  Assessment and Plan  1. Chronic hypertension with exacerbation during pregnancy in third trimester - Rx: Procardia XR 30 mg po  daily - BP  check on Monday at [facility] appt - Return to MAU for BPs >160/110  2. [redacted] weeks gestation of pregnancy   - Discharge patient - Keep scheduled appt with [facility] - Patient verbalized an understanding of the plan of care and agrees.   Raelyn Mora, CNM 03/30/2023, 12:38 PM

## 2023-04-01 ENCOUNTER — Encounter (HOSPITAL_COMMUNITY): Payer: Self-pay | Admitting: Obstetrics and Gynecology

## 2023-04-01 ENCOUNTER — Inpatient Hospital Stay (HOSPITAL_COMMUNITY)
Admission: AD | Admit: 2023-04-01 | Discharge: 2023-04-01 | Disposition: A | Discharge status: Home / Self Care | Payer: BC Managed Care – PPO | Attending: Obstetrics and Gynecology | Admitting: Obstetrics and Gynecology

## 2023-04-01 DIAGNOSIS — O10013 Pre-existing essential hypertension complicating pregnancy, third trimester: Secondary | ICD-10-CM | POA: Diagnosis not present

## 2023-04-01 DIAGNOSIS — N858 Other specified noninflammatory disorders of uterus: Secondary | ICD-10-CM | POA: Insufficient documentation

## 2023-04-01 DIAGNOSIS — O368130 Decreased fetal movements, third trimester, not applicable or unspecified: Secondary | ICD-10-CM | POA: Diagnosis present

## 2023-04-01 DIAGNOSIS — O99843 Bariatric surgery status complicating pregnancy, third trimester: Secondary | ICD-10-CM | POA: Insufficient documentation

## 2023-04-01 DIAGNOSIS — O34219 Maternal care for unspecified type scar from previous cesarean delivery: Secondary | ICD-10-CM | POA: Insufficient documentation

## 2023-04-01 DIAGNOSIS — Z3A35 35 weeks gestation of pregnancy: Secondary | ICD-10-CM | POA: Diagnosis not present

## 2023-04-01 DIAGNOSIS — Z3689 Encounter for other specified antenatal screening: Secondary | ICD-10-CM | POA: Diagnosis not present

## 2023-04-01 HISTORY — DX: Essential (primary) hypertension: I10

## 2023-04-01 LAB — CBC
HCT: 39.6 % (ref 36.0–46.0)
Hemoglobin: 12.9 g/dL (ref 12.0–15.0)
MCH: 31.2 pg (ref 26.0–34.0)
MCHC: 32.6 g/dL (ref 30.0–36.0)
MCV: 95.7 fL (ref 80.0–100.0)
Platelets: 236 10*3/uL (ref 150–400)
RBC: 4.14 MIL/uL (ref 3.87–5.11)
RDW: 13.8 % (ref 11.5–15.5)
WBC: 11 10*3/uL — ABNORMAL HIGH (ref 4.0–10.5)
nRBC: 0 % (ref 0.0–0.2)

## 2023-04-01 LAB — COMPREHENSIVE METABOLIC PANEL
ALT: 14 U/L (ref 0–44)
AST: 14 U/L — ABNORMAL LOW (ref 15–41)
Albumin: 3.1 g/dL — ABNORMAL LOW (ref 3.5–5.0)
Alkaline Phosphatase: 88 U/L (ref 38–126)
Anion gap: 9 (ref 5–15)
BUN: 8 mg/dL (ref 6–20)
CO2: 21 mmol/L — ABNORMAL LOW (ref 22–32)
Calcium: 9.3 mg/dL (ref 8.9–10.3)
Chloride: 106 mmol/L (ref 98–111)
Creatinine, Ser: 0.47 mg/dL (ref 0.44–1.00)
GFR, Estimated: >60 mL/min (ref >60)
Glucose, Bld: 73 mg/dL (ref 70–99)
Potassium: 3.7 mmol/L (ref 3.5–5.1)
Sodium: 136 mmol/L (ref 135–145)
Total Bilirubin: 1.4 mg/dL — ABNORMAL HIGH (ref 0.3–1.2)
Total Protein: 7.1 g/dL (ref 6.5–8.1)

## 2023-04-01 LAB — URINALYSIS, ROUTINE W REFLEX MICROSCOPIC
Bilirubin Urine: NEGATIVE
Glucose, UA: NEGATIVE mg/dL
Hgb urine dipstick: NEGATIVE
Ketones, ur: 5 mg/dL — AB
Leukocytes,Ua: NEGATIVE
Nitrite: NEGATIVE
Protein, ur: NEGATIVE mg/dL
Specific Gravity, Urine: 1.016 (ref 1.005–1.030)
pH: 5 (ref 5.0–8.0)

## 2023-04-01 LAB — PROTEIN / CREATININE RATIO, URINE
Creatinine, Urine: 68 mg/dL
Total Protein, Urine: <6 mg/dL

## 2023-04-01 MED ORDER — NIFEDIPINE ER 30 MG PO TB24: 30.0000 mg | ORAL_TABLET | Freq: Two times a day (BID) | ORAL | 2 refills | Status: DC

## 2023-04-01 NOTE — MAU Provider Note (Signed)
S [name] is a 32 y.o. [phone] pregnant female at [redacted]w[redacted]d who presents to MAU today with complaint of DFM+.  Denies VB, LOF, contractions.  No FM prior to presentation, endorses small amount of FM since arrival.  [place] care at [facility]. Prenatal records not available.  Patient states pregnancy uncomplicated until gHTN diagnosis on 10/19. Did have HTN prior to pregnancy, but had been managed by diet and exercise prior to 10/19. Past pregnancies have all been Cesareans.  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 Has been getting weekly NSTs.  Normal BPP this AM at private practice office. Taking aspirin daily, started nifedipine 30 mg on 10/19 for HTN.  Denies dyspnea, vision changes, RUQ pain, and headache (occasionally gets headaches when BP is higher).  Confirmed with [facility] that she in fact had 8/8 BPP today in clinic, BP 140s/80s.   Pertinent items noted in HPI and remainder of comprehensive ROS otherwise negative.  O BP 135/71 (BP Location: Right Arm)   Pulse 95   Temp 98.6 F (37 C) (Oral)   Resp 17   Ht 5\' 5"  (1.651 m)   Wt (!) 163.2 kg   LMP 07/31/2022   SpO2 100%   BMI 59.87 kg/m   Physical Exam Constitutional:      General: She is not in acute distress.    Appearance: Normal appearance.  Cardiovascular:     Rate and Rhythm: Normal rate.  Pulmonary:     Effort: Pulmonary effort is normal. No respiratory distress.  Abdominal:     General: There is no distension.     Tenderness: There is no abdominal tenderness. There is no guarding or rebound.     Comments: gravid abdomen  Musculoskeletal:        General: No tenderness.     Right lower leg: No edema.     Left lower leg: No edema.  Neurological:     Mental Status: She is alert and oriented to person, place, and time. Mental status is at baseline.  Psychiatric:        Mood and Affect: Mood normal.        Behavior: Behavior normal.  NST Baseline 140/moderate variability/15x15 accels/no  decels   MDM: MAU Course:  Had single severe range BP on arrival. NST reactive. Verified with Timothy Lasso, MD from [facility] that patient had 8/8 BPP this AM. CMP 14/14, Cr 0.47 CBC WBC 11.0, plts 236 uPr/Cr below reportable range  UA with ketones but noninfectious  A [redacted] weeks gestation of pregnancy  NST (non-stress test) reactive Medical screening exam complete. Given reactive NST and previously normal BPP, no concerns at this time.  No evidence of PreE and asymptomatic.  Will titrate up nifedipine given poorly controlled BPs.  P Discharge from MAU in stable condition with DFM precautions. Increased nifedipine to 30 mg BID. Follow up at [facility] in 48 hrs for BP check.  Allergies as of 04/01/2023       Reactions   Penicillins Hives   Has patient had a PCN reaction causing immediate rash, facial/tongue/throat swelling, SOB or lightheadedness with hypotension: No Has patient had a PCN reaction causing severe rash involving mucus membranes or skin necrosis: No Has patient had a PCN reaction that required hospitalization: No Has patient had a PCN reaction occurring within the last 10 years: No If all of the above answers are "NO", then may proceed with Cephalosporin use.        Medication List  STOP taking these medications    calcium-vitamin D 500-200 MG-UNIT tablet Commonly known as: OSCAL WITH D   ondansetron 4 MG tablet Commonly known as: ZOFRAN       TAKE these medications    acetaminophen 650 MG CR tablet Commonly known as: TYLENOL Take 650 mg by mouth every 8 (eight) hours as needed for pain.   aspirin EC 81 MG tablet Take 81 mg by mouth daily. Swallow whole. What changed: Another medication with the same name was removed. Continue taking this medication, and follow the directions you see here.   cetirizine 10 MG chewable tablet Commonly known as: ZYRTEC Chew 10 mg by mouth daily.   NIFEdipine 30 MG 24 hr tablet Commonly known as: ADALAT  CC Take 1 tablet (30 mg total) by mouth in the morning and at bedtime. What changed: when to take this   prenatal multivitamin Tabs tablet Take 1 tablet by mouth daily at 12 noon.        Hessie Dibble, MD 04/01/2023 5:39 PM

## 2023-04-01 NOTE — MAU Note (Signed)
.  Stacey Robinson is a 32 y.o. at [redacted]w[redacted]d here in MAU reporting: DFM - hasn't felt any movement in the last 2.5 hours. Denies pain, VB, or LOF. She also reports h/o CHTN, but her blood pressure had been normal up until her last visit. Started on meds, taking as prescribed.   PIH Assessment: Headache present: No  Visual disturbances: None RUQ pain/Epigastric: None Atypical edema: None Hx of HBP: CHTN BP Medications: Nifedipine (Procardia) 30 mg and bASA - last dose taken around 1030  LMP: N/A Onset of complaint: Today Pain score: denies pain Vitals:   04/01/23 1403  BP: (!) 160/89  Pulse: 87  Resp: 17  Temp: 98.6 F (37 C)  SpO2: 100%     FHT:139 Lab orders placed from triage:  UA

## 2023-04-01 NOTE — Progress Notes (Signed)
EFM removed by Dr. Judd Lien.

## 2023-04-04 ENCOUNTER — Ambulatory Visit: Payer: BC Managed Care – PPO

## 2023-04-04 ENCOUNTER — Ambulatory Visit: Payer: BC Managed Care – PPO | Attending: Maternal & Fetal Medicine

## 2023-04-05 ENCOUNTER — Encounter (HOSPITAL_COMMUNITY): Payer: Self-pay

## 2023-04-05 NOTE — Patient Instructions (Signed)
Charlen Kaminska  04/05/2023   Your procedure is scheduled on:  04/10/2023  Arrive at 1230 at Entrance C on [address] at [facility]  and [employer]. You are invited to use the FREE valet parking or use the Visitor's parking deck.  Pick up the phone at the desk and dial [phone].  Call this number if you have problems the morning of surgery: [phone]  Remember:   Do not eat food:(After Midnight) Desps de medianoche.  Do not drink clear liquids: (4 Hours before arrival) 4 horas ante llegada.You may drink clear liquids until arrival at __1230___.  Clear liquids means a liquid you can see thru.  It can have color such as Cola or Kool aid.  Tea is OK and coffee as long as no milk or creamer of any kind.  Take these medicines the morning of surgery with A SIP OF WATER:  Nifedipine   Do not wear jewelry, make-up or nail polish.  Do not wear lotions, powders, or perfumes. Do not wear deodorant.  Do not shave 48 hours prior to surgery.  Do not bring valuables to the hospital.  [facility] is not   responsible for any belongings or valuables brought to the hospital.  Contacts, dentures or bridgework may not be worn into surgery.  Leave suitcase in the car. After surgery it may be brought to your room.  For patients admitted to the hospital, checkout time is 11:00 AM the day of              discharge.      Please read over the following fact sheets that you were given:     Preparing for Surgery

## 2023-04-08 ENCOUNTER — Ambulatory Visit (HOSPITAL_COMMUNITY)
Admission: RE | Admit: 2023-04-08 | Discharge: 2023-04-08 | Disposition: A | Discharge status: Home / Self Care | Payer: BC Managed Care – PPO | Source: Ambulatory Visit | Attending: Obstetrics and Gynecology | Admitting: Obstetrics and Gynecology

## 2023-04-08 DIAGNOSIS — Z01812 Encounter for preprocedural laboratory examination: Secondary | ICD-10-CM | POA: Insufficient documentation

## 2023-04-08 DIAGNOSIS — Z3A37 37 weeks gestation of pregnancy: Secondary | ICD-10-CM | POA: Insufficient documentation

## 2023-04-08 DIAGNOSIS — O34211 Maternal care for low transverse scar from previous cesarean delivery: Secondary | ICD-10-CM | POA: Insufficient documentation

## 2023-04-08 LAB — CBC
HCT: 38.3 % (ref 36.0–46.0)
Hemoglobin: 12.7 g/dL (ref 12.0–15.0)
MCH: 31.8 pg (ref 26.0–34.0)
MCHC: 33.2 g/dL (ref 30.0–36.0)
MCV: 95.8 fL (ref 80.0–100.0)
Platelets: 238 10*3/uL (ref 150–400)
RBC: 4 MIL/uL (ref 3.87–5.11)
RDW: 13.7 % (ref 11.5–15.5)
WBC: 10.3 10*3/uL (ref 4.0–10.5)
nRBC: 0 % (ref 0.0–0.2)

## 2023-04-08 LAB — TYPE AND SCREEN
ABO/RH(D): O POS
Antibody Screen: NEGATIVE

## 2023-04-09 LAB — RPR: RPR Ser Ql: NONREACTIVE

## 2023-04-10 ENCOUNTER — Encounter (HOSPITAL_COMMUNITY): Payer: Self-pay | Admitting: Obstetrics and Gynecology

## 2023-04-10 ENCOUNTER — Other Ambulatory Visit: Payer: Self-pay

## 2023-04-10 ENCOUNTER — Inpatient Hospital Stay (HOSPITAL_COMMUNITY): Payer: Self-pay | Admitting: Anesthesiology

## 2023-04-10 ENCOUNTER — Encounter (HOSPITAL_COMMUNITY)
Admission: RE | Disposition: A | Discharge status: Home / Self Care | Payer: Self-pay | Source: Home / Self Care | Attending: Obstetrics and Gynecology

## 2023-04-10 ENCOUNTER — Inpatient Hospital Stay (HOSPITAL_COMMUNITY)
Admission: RE | Admit: 2023-04-10 | Discharge: 2023-04-12 | DRG: 784 | Disposition: A | Discharge status: Home / Self Care | Payer: BC Managed Care – PPO | Attending: Obstetrics and Gynecology | Admitting: Obstetrics and Gynecology

## 2023-04-10 ENCOUNTER — Inpatient Hospital Stay (HOSPITAL_COMMUNITY): Payer: BC Managed Care – PPO | Admitting: Anesthesiology

## 2023-04-10 DIAGNOSIS — O34211 Maternal care for low transverse scar from previous cesarean delivery: Secondary | ICD-10-CM | POA: Diagnosis present

## 2023-04-10 DIAGNOSIS — O99892 Other specified diseases and conditions complicating childbirth: Secondary | ICD-10-CM | POA: Diagnosis present

## 2023-04-10 DIAGNOSIS — O99214 Obesity complicating childbirth: Secondary | ICD-10-CM | POA: Diagnosis present

## 2023-04-10 DIAGNOSIS — Z01812 Encounter for preprocedural laboratory examination: Secondary | ICD-10-CM

## 2023-04-10 DIAGNOSIS — Z833 Family history of diabetes mellitus: Secondary | ICD-10-CM

## 2023-04-10 DIAGNOSIS — O99824 Streptococcus B carrier state complicating childbirth: Secondary | ICD-10-CM | POA: Diagnosis present

## 2023-04-10 DIAGNOSIS — O1092 Unspecified pre-existing hypertension complicating childbirth: Secondary | ICD-10-CM | POA: Diagnosis present

## 2023-04-10 DIAGNOSIS — N736 Female pelvic peritoneal adhesions (postinfective): Secondary | ICD-10-CM | POA: Diagnosis present

## 2023-04-10 DIAGNOSIS — O99844 Bariatric surgery status complicating childbirth: Secondary | ICD-10-CM | POA: Diagnosis present

## 2023-04-10 DIAGNOSIS — Z880 Allergy status to penicillin: Secondary | ICD-10-CM | POA: Diagnosis not present

## 2023-04-10 DIAGNOSIS — Z302 Encounter for sterilization: Secondary | ICD-10-CM

## 2023-04-10 DIAGNOSIS — Z8249 Family history of ischemic heart disease and other diseases of the circulatory system: Secondary | ICD-10-CM

## 2023-04-10 DIAGNOSIS — Z803 Family history of malignant neoplasm of breast: Secondary | ICD-10-CM

## 2023-04-10 DIAGNOSIS — Z3A37 37 weeks gestation of pregnancy: Secondary | ICD-10-CM | POA: Diagnosis not present

## 2023-04-10 HISTORY — PX: TUBAL LIGATION: SHX77

## 2023-04-10 LAB — CBC
HCT: 37.7 % (ref 36.0–46.0)
Hemoglobin: 12.4 g/dL (ref 12.0–15.0)
MCH: 31.3 pg (ref 26.0–34.0)
MCHC: 32.9 g/dL (ref 30.0–36.0)
MCV: 95.2 fL (ref 80.0–100.0)
Platelets: 230 10*3/uL (ref 150–400)
RBC: 3.96 MIL/uL (ref 3.87–5.11)
RDW: 13.4 % (ref 11.5–15.5)
WBC: 16.9 10*3/uL — ABNORMAL HIGH (ref 4.0–10.5)
nRBC: 0 % (ref 0.0–0.2)

## 2023-04-10 LAB — CREATININE, SERUM
Creatinine, Ser: 0.51 mg/dL (ref 0.44–1.00)
GFR, Estimated: >60 mL/min (ref >60)

## 2023-04-10 SURGERY — Surgical Case
Anesthesia: Spinal

## 2023-04-10 MED ORDER — NIFEDIPINE ER OSMOTIC RELEASE 30 MG PO TB24: 60.0000 mg | ORAL_TABLET | Freq: Every day | ORAL | Status: DC

## 2023-04-10 MED ORDER — FENTANYL CITRATE (PF) 100 MCG/2ML IJ SOLN
INTRAMUSCULAR | Status: DC | PRN
  Administered 2023-04-10: 85 ug via INTRAVENOUS

## 2023-04-10 MED ORDER — MEPERIDINE HCL 25 MG/ML IJ SOLN: 6.2500 mg | INTRAMUSCULAR | Status: DC | PRN

## 2023-04-10 MED ORDER — FENTANYL CITRATE (PF) 100 MCG/2ML IJ SOLN
INTRAMUSCULAR | Status: AC
  Filled 2023-04-10: qty 2

## 2023-04-10 MED ORDER — MORPHINE SULFATE (PF) 0.5 MG/ML IJ SOLN
INTRAMUSCULAR | Status: AC
  Filled 2023-04-10: qty 10

## 2023-04-10 MED ORDER — LIDOCAINE-EPINEPHRINE (PF) 2 %-1:200000 IJ SOLN: INTRAMUSCULAR | Status: DC | PRN

## 2023-04-10 MED ORDER — PHENYLEPHRINE HCL-NACL 20-0.9 MG/250ML-% IV SOLN
INTRAVENOUS | Status: AC
  Filled 2023-04-10: qty 250

## 2023-04-10 MED ORDER — PRENATAL MULTIVITAMIN CH
1.0000 | ORAL_TABLET | Freq: Every day | ORAL | Status: DC
  Administered 2023-04-11 – 2023-04-12 (×2): 1 via ORAL
  Filled 2023-04-10 (×2): qty 1

## 2023-04-10 MED ORDER — ENOXAPARIN SODIUM 80 MG/0.8ML IJ SOSY
80.0000 mg | PREFILLED_SYRINGE | INTRAMUSCULAR | Status: DC
  Administered 2023-04-11 – 2023-04-12 (×2): 80 mg via SUBCUTANEOUS
  Filled 2023-04-10 (×2): qty 0.8

## 2023-04-10 MED ORDER — DEXAMETHASONE SODIUM PHOSPHATE 4 MG/ML IJ SOLN
INTRAMUSCULAR | Status: AC
Start: 2023-04-10 — End: ?
  Filled 2023-04-10: qty 2

## 2023-04-10 MED ORDER — ACETAMINOPHEN 160 MG/5ML PO SOLN: 325.0000 mg | ORAL | Status: DC | PRN

## 2023-04-10 MED ORDER — LIDOCAINE-EPINEPHRINE (PF) 2 %-1:200000 IJ SOLN
INTRAMUSCULAR | Status: AC
  Filled 2023-04-10: qty 20

## 2023-04-10 MED ORDER — CLINDAMYCIN PHOSPHATE 900 MG/50ML IV SOLN
900.0000 mg | INTRAVENOUS | Status: AC
  Administered 2023-04-10: 900 mg via INTRAVENOUS

## 2023-04-10 MED ORDER — SCOPOLAMINE 1 MG/3DAYS TD PT72: 1.0000 | MEDICATED_PATCH | Freq: Once | TRANSDERMAL | Status: DC

## 2023-04-10 MED ORDER — ONDANSETRON HCL 4 MG/2ML IJ SOLN: 4.0000 mg | Freq: Three times a day (TID) | INTRAMUSCULAR | Status: DC | PRN

## 2023-04-10 MED ORDER — BUPIVACAINE IN DEXTROSE 0.75-8.25 % IT SOLN
INTRATHECAL | Status: DC | PRN
  Administered 2023-04-10: 1.5 mL via INTRATHECAL

## 2023-04-10 MED ORDER — TRANEXAMIC ACID-NACL 1000-0.7 MG/100ML-% IV SOLN
INTRAVENOUS | Status: AC
Start: 2023-04-10 — End: ?
  Filled 2023-04-10: qty 100

## 2023-04-10 MED ORDER — MENTHOL 3 MG MT LOZG: 1.0000 | LOZENGE | OROMUCOSAL | Status: DC | PRN

## 2023-04-10 MED ORDER — ACETAMINOPHEN 10 MG/ML IV SOLN: 1000.0000 mg | Freq: Once | INTRAVENOUS | Status: DC | PRN

## 2023-04-10 MED ORDER — NIFEDIPINE ER OSMOTIC RELEASE 30 MG PO TB24
30.0000 mg | ORAL_TABLET | Freq: Every day | ORAL | Status: DC
  Administered 2023-04-11: 30 mg via ORAL
  Filled 2023-04-10: qty 1

## 2023-04-10 MED ORDER — OXYCODONE HCL 5 MG PO TABS
5.0000 mg | ORAL_TABLET | ORAL | Status: DC | PRN
  Administered 2023-04-11: 5 mg via ORAL
  Administered 2023-04-11 – 2023-04-12 (×6): 10 mg via ORAL
  Administered 2023-04-12: 5 mg via ORAL
  Filled 2023-04-10: qty 1
  Filled 2023-04-10 (×3): qty 2
  Filled 2023-04-10: qty 1
  Filled 2023-04-10 (×2): qty 2
  Filled 2023-04-10: qty 1
  Filled 2023-04-10: qty 2

## 2023-04-10 MED ORDER — CHLOROPROCAINE HCL (PF) 3 % IJ SOLN
INTRAMUSCULAR | Status: AC
  Filled 2023-04-10: qty 20

## 2023-04-10 MED ORDER — HYDROMORPHONE HCL 1 MG/ML IJ SOLN: 0.2000 mg | INTRAMUSCULAR | Status: DC | PRN

## 2023-04-10 MED ORDER — PHENYLEPHRINE HCL-NACL 20-0.9 MG/250ML-% IV SOLN
INTRAVENOUS | Status: DC | PRN
  Administered 2023-04-10: 60 ug/min via INTRAVENOUS

## 2023-04-10 MED ORDER — DIPHENHYDRAMINE HCL 50 MG/ML IJ SOLN: 12.5000 mg | INTRAMUSCULAR | Status: DC | PRN

## 2023-04-10 MED ORDER — PHENYLEPHRINE HCL (PRESSORS) 10 MG/ML IV SOLN
INTRAVENOUS | Status: DC | PRN
  Administered 2023-04-10: 80 ug via INTRAVENOUS
  Administered 2023-04-10: 160 ug via INTRAVENOUS
  Administered 2023-04-10: 80 ug via INTRAVENOUS
  Administered 2023-04-10 (×2): 160 ug via INTRAVENOUS

## 2023-04-10 MED ORDER — CLINDAMYCIN PHOSPHATE 900 MG/50ML IV SOLN
INTRAVENOUS | Status: AC
  Filled 2023-04-10: qty 50

## 2023-04-10 MED ORDER — DIPHENHYDRAMINE HCL 25 MG PO CAPS: 25.0000 mg | ORAL_CAPSULE | ORAL | Status: DC | PRN

## 2023-04-10 MED ORDER — ONDANSETRON HCL 4 MG/2ML IJ SOLN
INTRAMUSCULAR | Status: DC | PRN
  Administered 2023-04-10: 4 mg via INTRAVENOUS

## 2023-04-10 MED ORDER — OXYTOCIN-SODIUM CHLORIDE 30-0.9 UT/500ML-% IV SOLN
INTRAVENOUS | Status: AC
  Filled 2023-04-10: qty 500

## 2023-04-10 MED ORDER — SIMETHICONE 80 MG PO CHEW
80.0000 mg | CHEWABLE_TABLET | Freq: Three times a day (TID) | ORAL | Status: DC
  Administered 2023-04-11 – 2023-04-12 (×4): 80 mg via ORAL
  Filled 2023-04-10 (×5): qty 1

## 2023-04-10 MED ORDER — FENTANYL CITRATE (PF) 100 MCG/2ML IJ SOLN: 25.0000 ug | INTRAMUSCULAR | Status: DC | PRN

## 2023-04-10 MED ORDER — COCONUT OIL OIL: 1.0000 | TOPICAL_OIL | Status: DC | PRN

## 2023-04-10 MED ORDER — METOCLOPRAMIDE HCL 5 MG/ML IJ SOLN
INTRAMUSCULAR | Status: AC
Start: 2023-04-10 — End: ?
  Filled 2023-04-10: qty 2

## 2023-04-10 MED ORDER — SENNOSIDES-DOCUSATE SODIUM 8.6-50 MG PO TABS
2.0000 | ORAL_TABLET | Freq: Every day | ORAL | Status: DC
  Administered 2023-04-11 – 2023-04-12 (×2): 2 via ORAL
  Filled 2023-04-10 (×2): qty 2

## 2023-04-10 MED ORDER — STERILE WATER FOR IRRIGATION IR SOLN
Status: DC | PRN
  Administered 2023-04-10: 1

## 2023-04-10 MED ORDER — CHLOROPROCAINE HCL (PF) 3 % IJ SOLN
INTRAMUSCULAR | Status: DC | PRN
  Administered 2023-04-10: 20 mL

## 2023-04-10 MED ORDER — OXYTOCIN-SODIUM CHLORIDE 30-0.9 UT/500ML-% IV SOLN
INTRAVENOUS | Status: DC | PRN
  Administered 2023-04-10: 400 mL via INTRAVENOUS

## 2023-04-10 MED ORDER — LIDOCAINE-EPINEPHRINE (PF) 2 %-1:200000 IJ SOLN
INTRAMUSCULAR | Status: DC | PRN
  Administered 2023-04-10: 2 mL
  Administered 2023-04-10: 1.5 mL

## 2023-04-10 MED ORDER — DEXMEDETOMIDINE HCL IN NACL 80 MCG/20ML IV SOLN
INTRAVENOUS | Status: DC | PRN
  Administered 2023-04-10: 2 ug via INTRAVENOUS
  Administered 2023-04-10: 8 ug via INTRAVENOUS
  Administered 2023-04-10: 2 ug via INTRAVENOUS
  Administered 2023-04-10: 8 ug via INTRAVENOUS

## 2023-04-10 MED ORDER — ONDANSETRON HCL 4 MG/2ML IJ SOLN
INTRAMUSCULAR | Status: AC
  Filled 2023-04-10: qty 2

## 2023-04-10 MED ORDER — TRANEXAMIC ACID-NACL 1000-0.7 MG/100ML-% IV SOLN
1000.0000 mg | Freq: Once | INTRAVENOUS | Status: AC
  Administered 2023-04-10: 1000 mg via INTRAVENOUS

## 2023-04-10 MED ORDER — ACETAMINOPHEN 325 MG PO TABS: 325.0000 mg | ORAL_TABLET | ORAL | Status: DC | PRN

## 2023-04-10 MED ORDER — NALOXONE HCL 0.4 MG/ML IJ SOLN: 0.4000 mg | INTRAMUSCULAR | Status: DC | PRN

## 2023-04-10 MED ORDER — WITCH HAZEL-GLYCERIN EX PADS: 1.0000 | MEDICATED_PAD | CUTANEOUS | Status: DC | PRN

## 2023-04-10 MED ORDER — METHYLERGONOVINE MALEATE 0.2 MG/ML IJ SOLN: 0.2000 mg | INTRAMUSCULAR | Status: DC | PRN

## 2023-04-10 MED ORDER — SODIUM CHLORIDE 0.9% FLUSH: 3.0000 mL | INTRAVENOUS | Status: DC | PRN

## 2023-04-10 MED ORDER — ACETAMINOPHEN 10 MG/ML IV SOLN
INTRAVENOUS | Status: AC
  Filled 2023-04-10: qty 100

## 2023-04-10 MED ORDER — OXYTOCIN-SODIUM CHLORIDE 30-0.9 UT/500ML-% IV SOLN
2.5000 [IU]/h | INTRAVENOUS | Status: AC
  Administered 2023-04-10 (×2): 2.5 [IU]/h via INTRAVENOUS
  Filled 2023-04-10: qty 500

## 2023-04-10 MED ORDER — POVIDONE-IODINE 10 % EX SWAB
2.0000 | Freq: Once | CUTANEOUS | Status: AC
  Administered 2023-04-10: 2 via TOPICAL

## 2023-04-10 MED ORDER — DIPHENHYDRAMINE HCL 25 MG PO CAPS: 25.0000 mg | ORAL_CAPSULE | Freq: Four times a day (QID) | ORAL | Status: DC | PRN

## 2023-04-10 MED ORDER — NALOXONE HCL 4 MG/10ML IJ SOLN: 1.0000 ug/kg/h | INTRAVENOUS | Status: DC | PRN

## 2023-04-10 MED ORDER — DEXAMETHASONE SODIUM PHOSPHATE 10 MG/ML IJ SOLN
INTRAMUSCULAR | Status: DC | PRN
  Administered 2023-04-10: 8 mg via INTRAVENOUS

## 2023-04-10 MED ORDER — SCOPOLAMINE 1 MG/3DAYS TD PT72
MEDICATED_PATCH | TRANSDERMAL | Status: AC
  Filled 2023-04-10: qty 1

## 2023-04-10 MED ORDER — ZOLPIDEM TARTRATE 5 MG PO TABS: 5.0000 mg | ORAL_TABLET | Freq: Every evening | ORAL | Status: DC | PRN

## 2023-04-10 MED ORDER — FENTANYL CITRATE (PF) 100 MCG/2ML IJ SOLN
INTRAMUSCULAR | Status: DC | PRN
  Administered 2023-04-10 (×2): 15 ug via INTRATHECAL

## 2023-04-10 MED ORDER — ACETAMINOPHEN 10 MG/ML IV SOLN
INTRAVENOUS | Status: DC | PRN
  Administered 2023-04-10: 1000 mg via INTRAVENOUS

## 2023-04-10 MED ORDER — PHENYLEPHRINE 80 MCG/ML (10ML) SYRINGE FOR IV PUSH (FOR BLOOD PRESSURE SUPPORT)
PREFILLED_SYRINGE | INTRAVENOUS | Status: AC
  Filled 2023-04-10: qty 10

## 2023-04-10 MED ORDER — LACTATED RINGERS IV SOLN: INTRAVENOUS | Status: DC

## 2023-04-10 MED ORDER — METHYLERGONOVINE MALEATE 0.2 MG PO TABS: 0.2000 mg | ORAL_TABLET | ORAL | Status: DC | PRN

## 2023-04-10 MED ORDER — SIMETHICONE 80 MG PO CHEW
80.0000 mg | CHEWABLE_TABLET | ORAL | Status: DC | PRN
  Filled 2023-04-10: qty 1

## 2023-04-10 MED ORDER — MORPHINE SULFATE (PF) 0.5 MG/ML IJ SOLN
INTRAMUSCULAR | Status: DC | PRN
  Administered 2023-04-10: 150 ug via INTRATHECAL

## 2023-04-10 MED ORDER — DIBUCAINE (PERIANAL) 1 % EX OINT: 1.0000 | TOPICAL_OINTMENT | CUTANEOUS | Status: DC | PRN

## 2023-04-10 MED ORDER — ACETAMINOPHEN 500 MG PO TABS
1000.0000 mg | ORAL_TABLET | Freq: Four times a day (QID) | ORAL | Status: DC
  Administered 2023-04-10 – 2023-04-12 (×7): 1000 mg via ORAL
  Filled 2023-04-10 (×7): qty 2

## 2023-04-10 MED ORDER — GENTAMICIN SULFATE 40 MG/ML IJ SOLN
5.0000 mg/kg | INTRAVENOUS | Status: AC
  Administered 2023-04-10: 500 mg via INTRAVENOUS
  Filled 2023-04-10: qty 12.5

## 2023-04-10 SURGICAL SUPPLY — 39 items
ADH SKN CLS APL DERMABOND .7 (GAUZE/BANDAGES/DRESSINGS) ×2
APL SKNCLS STERI-STRIP NONHPOA (GAUZE/BANDAGES/DRESSINGS) ×2
BENZOIN TINCTURE PRP APPL 2/3 (GAUZE/BANDAGES/DRESSINGS) IMPLANT
CHLORAPREP W/TINT 26ML (MISCELLANEOUS) ×4 IMPLANT
CLAMP UMBILICAL CORD (MISCELLANEOUS) ×2 IMPLANT
CLIP FILSHIE TUBAL LIGA STRL (Clip) ×2 IMPLANT
CLOTH BEACON ORANGE TIMEOUT ST (SAFETY) ×2 IMPLANT
DERMABOND ADVANCED .7 DNX12 (GAUZE/BANDAGES/DRESSINGS) IMPLANT
DISSECTOR SURG LIGASURE 21 (MISCELLANEOUS) IMPLANT
DRESSING PREVENA PLUS CUSTOM (GAUZE/BANDAGES/DRESSINGS) IMPLANT
DRSG OPSITE POSTOP 4X10 (GAUZE/BANDAGES/DRESSINGS) ×2 IMPLANT
DRSG PREVENA PLUS CUSTOM (GAUZE/BANDAGES/DRESSINGS) ×2
ELECT REM PT RETURN 9FT ADLT (ELECTROSURGICAL) ×2
ELECTRODE REM PT RTRN 9FT ADLT (ELECTROSURGICAL) ×2 IMPLANT
EXTRACTOR VACUUM KIWI (MISCELLANEOUS) IMPLANT
GAUZE SPONGE 4X4 12PLY STRL LF (GAUZE/BANDAGES/DRESSINGS) IMPLANT
GLOVE BIO SURGEON STRL SZ7 (GLOVE) ×2 IMPLANT
GLOVE BIOGEL PI IND STRL 7.0 (GLOVE) ×4 IMPLANT
GOWN STRL REUS W/TWL LRG LVL3 (GOWN DISPOSABLE) ×4 IMPLANT
KIT ABG SYR 3ML LUER SLIP (SYRINGE) IMPLANT
MAT PREVALON FULL STRYKER (MISCELLANEOUS) IMPLANT
NDL HYPO 25X5/8 SAFETYGLIDE (NEEDLE) IMPLANT
NEEDLE HYPO 22GX1.5 SAFETY (NEEDLE) IMPLANT
NEEDLE HYPO 25X5/8 SAFETYGLIDE (NEEDLE) IMPLANT
NS IRRIG 1000ML POUR BTL (IV SOLUTION) ×2 IMPLANT
PACK C SECTION WH (CUSTOM PROCEDURE TRAY) ×2 IMPLANT
PAD OB MATERNITY 4.3X12.25 (PERSONAL CARE ITEMS) ×2 IMPLANT
RETAINER VISCERAL (MISCELLANEOUS) IMPLANT
RTRCTR C-SECT PINK 25CM LRG (MISCELLANEOUS) ×2 IMPLANT
SUT CHROMIC 1 CTX 36 (SUTURE) ×4 IMPLANT
SUT CHROMIC 2 0 CT 1 (SUTURE) ×2 IMPLANT
SUT PDS AB 0 CTX 60 (SUTURE) ×2 IMPLANT
SUT VIC AB 2-0 CT1 27 (SUTURE) ×2
SUT VIC AB 2-0 CT1 TAPERPNT 27 (SUTURE) ×2 IMPLANT
SUT VIC AB 4-0 KS 27 (SUTURE) IMPLANT
SYR 30ML LL (SYRINGE) IMPLANT
TOWEL OR 17X24 6PK STRL BLUE (TOWEL DISPOSABLE) ×2 IMPLANT
TRAY FOLEY W/BAG SLVR 14FR LF (SET/KITS/TRAYS/PACK) ×2 IMPLANT
WATER STERILE IRR 1000ML POUR (IV SOLUTION) ×2 IMPLANT

## 2023-04-10 NOTE — Anesthesia Preprocedure Evaluation (Signed)
Anesthesia Evaluation  Patient identified by MRN, date of birth, ID band Patient awake    Reviewed: Allergy & Precautions, Patient's Chart, lab work & pertinent test results  Airway Mallampati: II  TM Distance: >3 FB Neck ROM: Full    Dental  (+) Teeth Intact, Dental Advisory Given   Pulmonary neg pulmonary ROS   breath sounds clear to auscultation       Cardiovascular hypertension,  Rhythm:Regular Rate:Normal     Neuro/Psych negative neurological ROS  negative psych ROS   GI/Hepatic negative GI ROS, Neg liver ROS,,,  Endo/Other  negative endocrine ROS    Renal/GU negative Renal ROS     Musculoskeletal negative musculoskeletal ROS (+)    Abdominal   Peds  Hematology negative hematology ROS (+)   Anesthesia Other Findings   Reproductive/Obstetrics (+) Pregnancy                             Anesthesia Physical Anesthesia Plan  ASA: 3  Anesthesia Plan: Combined Spinal and Epidural   Post-op Pain Management:    Induction:   PONV Risk Score and Plan: 0  Airway Management Planned: Natural Airway  Additional Equipment: None  Intra-op Plan:   Post-operative Plan:   Informed Consent: I have reviewed the patients History and Physical, chart, labs and discussed the procedure including the risks, benefits and alternatives for the proposed anesthesia with the patient or authorized representative who has indicated his/her understanding and acceptance.       Plan Discussed with: CRNA  Anesthesia Plan Comments: (Lab Results      Component                Value               Date                      WBC                      10.3                04/08/2023                HGB                      12.7                04/08/2023                HCT                      38.3                04/08/2023                MCV                      95.8                04/08/2023                PLT                       238                 04/08/2023           )  Anesthesia Quick Evaluation

## 2023-04-10 NOTE — Lactation Note (Signed)
This note was copied from a baby's chart. Lactation Consultation Note  Patient Name: Girl Zynasia Mcclarin Today's Date: 04/10/2023 Age:34 hours Reason for consult: Initial assessment;Early term 37-38.6wks;Maternal endocrine disorder  P4- MOB reports that so far infant has been nursing well. MOB denies any pain or pinching while infant nurses. MOB denies having any questions or concerns at this time. LC encouraged MOB to call for a latch assessment at some point before her discharge.   LC reviewed feeding infant on cue 8-12x in 24 hrs, not allowing infant to go over 3 hrs without feeding, LC services and CDC milk storage guidelines.   Maternal Data Does the patient have breastfeeding experience prior to this delivery?: Yes How long did the patient breastfeed?: a few months  Feeding Mother's Current Feeding Choice: Breast Milk Nipple Type: Slow - flow  Lactation Tools Discussed/Used Pump Education: Milk Storage  Interventions Interventions: Breast feeding basics reviewed;Education;LC Services brochure  Discharge Discharge Education: Warning signs for feeding baby Pump: Hands Free;Personal  Consult Status Consult Status: Follow-up Date: 04/11/23 Follow-up type: In-patient    Dema Severin BS, IBCLC 04/10/2023, 10:25 PM

## 2023-04-10 NOTE — Anesthesia Procedure Notes (Addendum)
Epidural Patient location during procedure: OB Start time: 04/10/2023 2:44 PM End time: 04/10/2023 2:50 PM  Staffing Anesthesiologist: Shelton Silvas, MD Performed: anesthesiologist   Preanesthetic Checklist Completed: patient identified, IV checked, site marked, risks and benefits discussed, surgical consent, monitors and equipment checked, pre-op evaluation and timeout performed  Epidural Patient position: sitting Prep: DuraPrep Patient monitoring: heart rate, continuous pulse ox and blood pressure Approach: midline Location: L3-L4 Injection technique: LOR saline  Needle:  Needle type: Tuohy  Needle gauge: 17 G Needle length: 9 cm Catheter type: closed end flexible Catheter size: 20 Guage Test dose: negative  Assessment Events: blood not aspirated, no cerebrospinal fluid, injection not painful, no injection resistance and no paresthesia  Additional Notes CSE performed. LOR @ 5.5. Spinal needle introduced. CSF fluid flowing. Intrathecal dose injected. Spinal needle removed. Epidural catheter inserted with some resistance prior to a "pop" sensation and easy advancement. Epidural catheter secured. Suspect possible intrathecal catheter but will not remove due to suspected complexity and length of the case.    Patient identified. Risks/Benefits/Options discussed with patient including but not limited to bleeding, infection, nerve damage, paralysis, failed block, incomplete pain control, headache, blood pressure changes, nausea, vomiting, reactions to medications, itching and postpartum back pain. Confirmed with bedside nurse the patient's most recent platelet count. Confirmed with patient that they are not currently taking any anticoagulation, have any bleeding history or any family history of bleeding disorders. Patient expressed understanding and wished to proceed. All questions were answered. Sterile technique was used throughout the entire procedure. Please see nursing notes for  vital signs. Test dose was given through epidural catheter and negative prior to continuing to dose epidural or start infusion. Warning signs of high block given to the patient including shortness of breath, tingling/numbness in hands, complete motor block, or any concerning symptoms with instructions to call for help. Patient was given instructions on fall risk and not to get out of bed. All questions and concerns addressed with instructions to call with any issues or inadequate analgesia.    Reason for block:procedure for pain

## 2023-04-10 NOTE — H&P (Signed)
Stacey Robinson is a 32 y.o. female presenting for scheduled cesarean section and bilateral salpingectomy  32 year old G4 P3-0-0-3 at 37+0 presents for repeat low-transverse cesarean section and bilateral partial salpingectomy for worsening chronic hypertension.  The patient's pregnancy has been complicated by chronic hypertension, morbid obesity, history of bariatric surgery, history of prior cesarean section x 3.  Patient has a history of chronic hypertension outside of pregnancy following her bariatric surgery procedure in July 2023 she was able to come off of her antihypertensive medications.  She was noted to have increasing blood pressures around 35 weeks of this pregnancy and was started on antihypertensive medications.  Her blood pressures have continued to require increases in the dose of her antihypertensive to control her blood pressure therefore maternal-fetal medicine recommended proceeding with delivery at 37 weeks for worsening chronic hypertension.  BMI at the beginning of the prior pregnancy was 53.  Ultrasound for growth on 04/01/2023 showed estimated fetal weight of 51st percentile baby was also breech at that time.  Patient presents today for repeat cesarean section with bilateral salpingectomy for desired permanent sterilization.  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 3) history of chronic hypertension: Patient was not requiring medications for hypertension at the beginning of the pregnancy 4) history of cesarean section x 3: Ultrasound shows posterior placenta well away from prior cesarean scars 5) GBS positive via urine culture in the beginning of the pregnancy OB History     Gravida  4   Para  3   Term  3   Preterm      AB      Living  3      SAB      IAB      Ectopic      Multiple       Live Births  3          Past Medical History:  Diagnosis Date   Closed displaced trimalleolar fracture of left lower leg 01/17/2018   Enlarged thyroid    Hypertension    Pain in right ankle and joints of right foot 03/06/2018   Trimalleolar fracture of ankle, closed    Left   Vitamin D deficiency    Past Surgical History:  Procedure Laterality Date   CESAREAN SECTION     x 3   CHOLECYSTECTOMY     2011   GASTRECTOMY     ORIF ANKLE FRACTURE Left 12/02/2017   Procedure: OPEN REDUCTION INTERNAL FIXATION (ORIF) LEFT TRIMALLEOLAR ANKLE FRACTURE;  Surgeon: Tarry Kos, MD;  Location: [facility] OR;  Service: Orthopedics;  Laterality: Left;   Family History: family history includes Breast cancer in her paternal aunt; Diabetes in her father, mother, paternal aunt, and paternal uncle; Hypertension in her father and mother. Social History:  reports that she has never smoked. She has never used smokeless tobacco. She reports that she does not drink alcohol and does not use drugs.     Maternal Diabetes: No Genetic Screening: Normal Maternal Ultrasounds/Referrals: Normal Fetal Ultrasounds or other Referrals:  Referred to Materal Fetal Medicine  Maternal Substance Abuse:  No Significant Maternal Medications:  Meds include: Other:  Significant Maternal Lab Results:  Group B Strep positive Number of Prenatal Visits:greater than 3 verified prenatal visits Maternal Vaccinations:declined Flu, Tdap, RSV Other Comments:  None  Review of Systems History   Blood pressure 126/76,  pulse 79, temperature 98.3 F (36.8 C), temperature source Oral, resp. rate (!) 24, height 5\' 5"  (1.651 m), weight (!) 163.3 kg, last menstrual period 07/31/2022, SpO2 98%. Exam Physical Exam  Alert and oriented x 3, NAD Obese, gravid Fetal heart rate 140 Prenatal labs: ABO, Rh: --/--/O POS (10/28 1224) Antibody: NEG (10/28 1224) Rubella:  Immune RPR: NON REACTIVE (10/28 1300)  HBsAg:   Negative HIV:    Nonreactive GBS:   Positive   Assessment/Plan: 1) admit 2) gentamicin and clindamycin on-call to the OR for surgical prophylaxis 3) SCDs for DVT prophylaxis. Plan prophylactic Lovenox postpartum for DVT prophylaxis in addition to SCDs  4) informed consent obtained for repeat cesarean section and bilateral salpingectomy for desired permanent sterilization  Waynard Reeds 04/10/2023, 2:09 PM

## 2023-04-10 NOTE — Op Note (Addendum)
Operative Report   Pre-Operative Diagnosis: 1) 37.0-week intrauterine pregnancy 2) history of 3 prior cesarean sections 3) chronic hypertension requiring accelerated doses of antihypertensives 4) maternal morbid obesity 5) desired permanent sterilization  Postoperative Diagnosis: 1) 37.0-week intrauterine pregnancy 2) history of 3 prior cesarean sections 3) chronic hypertension requiring accelerated doses of antihypertensives 4) maternal morbid obesity 5) desired permanent sterilization 6) adhesive disease  Procedure: Repeat low-transverse cesarean section with extensive lysis of adhesions, bilateral salpingectomy  Surgeon: Dr. Waynard Reeds  Assistant: Dr. Derl Barrow  Antibiotics: Clindamycin 900 mg, gentamicin 500 mg Medications: Tranexamic acid 1 g, Nesacaine 2% 30 cc  Operative Findings: Vigorous female infant in the vertex presentation with Apgar scores of 8 at 1 minute and 8 at 5 minutes.  Significant adhesive disease involving the omentum with the anterior abdominal wall necessitating extensive lysis of adhesions.  Normal ovaries and tubes.  Mild adhesion of the right ovary to the mesosalpinx.   Specimen: Placenta for disposal, bilateral fallopian tubes to pathology  EBL: Total I/O In: 2362.5 [I.V.:2000; IV Piggyback:362.5] Out: 774 [Urine:200; Blood:574]   Procedure:Stacey Robinson is an 32 year old gravida 4 para 3003 at 37 weeks and 0 days estimated gestational age who presents for cesarean section. Following the appropriate informed consent the patient was brought to the operating room where combined spinal epidural anesthesia was administered and found to be adequate. She was placed in the dorsal supine position with a leftward tilt.  The Traxi panniculus retractor was placed on the abdomen to provide improved visualization of the surgical site.  She was prepped and draped in the normal sterile fashion.  The patient was appropriately identified during a preoperative timeout  procedure.  The scalpel was then used to make a Pfannenstiel skin incision which was carried down to the underlying layers of soft tissue to the fascia. The fascia was incised in the midline and the fascial incision was extended laterally with Mayo scissors. The superior aspect of the fascial incision was grasped with Coker clamps x2, tented up and the rectus muscles dissected off sharply with the electrocautery unit. The same procedure was repeated on the inferior aspect of the fascial incision. The rectus muscles were separated in the midline. The abdominal peritoneum was identified, tented up, entered sharply, and the incision was extended superiorly and inferiorly.  With entry into the abdominal cavity extensive adhesive disease involving the omentum to the anterior abdominal wall was noted.  Lysis of adhesions was undertaken with a combination of the electrocautery unit and the LigaSure device. The Alexis retractor was then deployed. The vesicouterine peritoneum was identified, tented up, entered sharply, and the bladder flap was created digitally.  The scalpel was then used to make a low transverse incision on the uterus which was extended laterally with blunt dissection. The fetal vertex was identified, delivered easily through the uterine incision followed by the body. The infant was bulb suctioned on the operative field and cried vigorously.  Following a 1 minute delay, the cord was clamped and cut. The infant was passed to the waiting neonatology team. Placenta was then delivered spontaneously and the uterus was cleared of all clot and debris. The uterine incision was repaired with #1 chromic in running locked fashion followed by a second was performed to achieve hemostasis. The ovaries and tubes were inspected and normal.  Attention was then turned to the left fallopian tube.  A moist laparotomy sponge was used to pack the bowels away into the upper abdomen.  The left fallopian tube was  identified and  tented up with Babcock clamps.  The LigaSure device was used to take successive bites of the mesosalpinx up to the level of the uterine fundus.  The fallopian tube was then clamped and cut at the level of the uterine fundus.  The same procedure was repeated on the left-hand side.  The Alexis retractor was removed. At this point during the procedure the patient started to develop more pain with manipulation of the bowels and peritoneum.  30 cc of Nesacaine 2% was used intra-abdominally to achieve pain control.  This was an adequate and the patient received additional analgesia through her epidural.  Once this was achieved the abdominal peritoneum and rectus muscles were reapproximated with 2-0 Vicryl in a running fashion.  The fascia was closed with 0-looped PDS in a running fashion.  Subcutaneous tissue was reapproximated with 2-0 plain gut interrupted sutures.  The skin was closed with 4-0 vicryl in a subcuticular fashion and Dermabond.  The Prevena wound VAC was then placed over the incision.  All laparotomy sponge, instrument, and needle counts were correct.  The patient tolerated the procedure well and was transferred to the recovery unit in stable condition following the procedure.

## 2023-04-10 NOTE — Transfer of Care (Signed)
Immediate Anesthesia Transfer of Care Note  Patient: Stacey Robinson  Procedure(s) Performed: CESAREAN SECTION WITH BILATERAL TUBAL LIGATION BILATERAL TUBAL LIGATION (Bilateral)  Patient Location: PACU  Anesthesia Type:Spinal and Epidural  Level of Consciousness: awake, alert , and oriented  Airway & Oxygen Therapy: Patient Spontanous Breathing  Post-op Assessment: Report given to RN and Post -op Vital signs reviewed and stable  Post vital signs: Reviewed and stable  Last Vitals:  Vitals Value Taken Time  BP 92/54 04/10/23 1700  Temp    Pulse 71 04/10/23 1706  Resp 20 04/10/23 1706  SpO2 99 % 04/10/23 1706  Vitals shown include unfiled device data.  Last Pain:  Vitals:   04/10/23 1301  TempSrc: Oral  PainSc: 0-No pain         Complications: No notable events documented.

## 2023-04-11 LAB — CBC
HCT: 33.3 % — ABNORMAL LOW (ref 36.0–46.0)
Hemoglobin: 10.8 g/dL — ABNORMAL LOW (ref 12.0–15.0)
MCH: 30.5 pg (ref 26.0–34.0)
MCHC: 32.4 g/dL (ref 30.0–36.0)
MCV: 94.1 fL (ref 80.0–100.0)
Platelets: 239 10*3/uL (ref 150–400)
RBC: 3.54 MIL/uL — ABNORMAL LOW (ref 3.87–5.11)
RDW: 13.4 % (ref 11.5–15.5)
WBC: 14.1 10*3/uL — ABNORMAL HIGH (ref 4.0–10.5)
nRBC: 0 % (ref 0.0–0.2)

## 2023-04-11 MED ORDER — NIFEDIPINE ER OSMOTIC RELEASE 30 MG PO TB24
30.0000 mg | ORAL_TABLET | Freq: Every day | ORAL | Status: DC
  Administered 2023-04-11: 30 mg via ORAL
  Filled 2023-04-11: qty 1

## 2023-04-11 NOTE — Anesthesia Postprocedure Evaluation (Addendum)
Anesthesia Post Note  Patient: Kierrah Jayme Cloud  Procedure(s) Performed: CESAREAN SECTION WITH BILATERAL TUBAL LIGATION BILATERAL TUBAL LIGATION (Bilateral)     Patient location during evaluation: PACU Anesthesia Type: Spinal Level of consciousness: oriented and awake and alert Pain management: pain level controlled Vital Signs Assessment: post-procedure vital signs reviewed and stable Respiratory status: spontaneous breathing, respiratory function stable and patient connected to nasal cannula oxygen Cardiovascular status: blood pressure returned to baseline and stable Postop Assessment: no headache, no backache and no apparent nausea or vomiting Anesthetic complications: no  No notable events documented.                 Shelton Silvas

## 2023-04-11 NOTE — Lactation Note (Signed)
This note was copied from a baby's chart. Lactation Consultation Note  Patient Name: Stacey Robinson Today's Date: 04/11/2023 Age:34 hours  Reason for consult: Early term 37-38.6wks;Mother's request;Breastfeeding assistance;Follow-up assessment  Mother requesting breastfeeding assistance. She states baby latched well after delivery then became sleepy. Mother says that she gave formula and has continued with formula feeding. In the past with her other children (13-17 years ago), she pumped and bottle fed and feels that she will most likely do the same after going home.  At this visit, mother wanted to put baby to breast. "[name]" latched with ease and fed with intermittent audible swallows. Mother has been supplementing with formula (15 ml).   Discussed the process of milk production, "supply and demand" and the importance of breast stimulation and milk removal in order to make an optimal milk supply.  Discussed mother to breastfeed 8-12 times in 24 hours, skin to skin and breast feed before formula feeding.  If missed feedings at breast or substituting feeding with formula, advised to hand express and/or pump to remove milk from the breast. Mother to request a pump , if needed.  Maternal Data Has patient been taught Hand Expression?: Yes  Feeding Mother's Current Feeding Choice: Breast Milk and Formula Nipple Type: Slow - flow  LATCH Score Latch: Grasps breast easily, tongue down, lips flanged, rhythmical sucking.  Audible Swallowing: A few with stimulation  Type of Nipple: Everted at rest and after stimulation  Comfort (Breast/Nipple): Soft / non-tender  Hold (Positioning): Assistance needed to correctly position infant at breast and maintain latch.  LATCH Score: 8   Lactation Tools Discussed/Used    Interventions Interventions: Breast feeding basics reviewed;Assisted with latch;Hand express;Position options;Support pillows;Education  Discharge Pump:  DEBP;Hands Free  Consult Status Consult Status: Follow-up Date: 04/11/23 Follow-up type: In-patient    Christella Hartigan M 04/11/2023, 3:47 PM

## 2023-04-11 NOTE — Plan of Care (Signed)
Patient continues to have incisional pain and cramping. Medication does bring relief. Patient ambulating in hallway. Breastfeeding and bottle feeding well.

## 2023-04-11 NOTE — Progress Notes (Signed)
Subjective: Postpartum Day 1: Cesarean Delivery Patient reports pain controlled, denies nausea or vomiting.  Tolerating p.o.  Ambulating and voiding without difficulty.  Objective: Vital signs in last 24 hours: Temp:  [98 F (36.7 C)-98.9 F (37.2 C)] 98 F (36.7 C) (10/31 1022) Pulse Rate:  [62-88] 86 (10/31 1022) Resp:  [12-24] 18 (10/31 1022) BP: (91-135)/(50-100) 135/100 (10/31 1022) SpO2:  [97 %-100 %] 99 % (10/31 1022) Weight:  [163.3 kg] 163.3 kg (10/30 1301)  Physical Exam:  General: alert, cooperative, and appears stated age Lochia: appropriate Uterine Fundus: firm Incision: healing well, wound VAC in place DVT Evaluation: No evidence of DVT seen on physical exam.  Recent Labs    04/10/23 1853 04/11/23 0426  HGB 12.4 10.8*  HCT 37.7 33.3*    Assessment/Plan: 1) Status post Cesarean section. Doing well postoperatively.  2) Continue current care. 3) Chronic hypertension: Patient was taking Procardia 30 twice daily as an outpatient.  Her Procardia dose was increased for few days to 60 every morning and 30 nightly for the 3 days preceding her admission.  Her p.m. dose of Procardia was held last night due to low blood pressure.  She did have a blood pressure of 135/100 at 10:00 this morning.  Prior to that had been normotensive.  Anticipate blood pressures will be more elevated as the patient becomes more active.  Will continue Procardia 30 twice daily 4) given BMI and cesarean delivery will administer prophylactic Lovenox for DVT prophylaxis while in the hospital  Waynard Reeds, MD 04/11/2023, 12:49 PM

## 2023-04-12 LAB — SURGICAL PATHOLOGY

## 2023-04-12 MED ORDER — OXYCODONE HCL 5 MG PO TABS: 5.0000 mg | ORAL_TABLET | ORAL | 0 refills | Status: DC | PRN

## 2023-04-12 MED ORDER — NIFEDIPINE ER OSMOTIC RELEASE 30 MG PO TB24
60.0000 mg | ORAL_TABLET | Freq: Every day | ORAL | Status: DC
  Administered 2023-04-12: 60 mg via ORAL
  Filled 2023-04-12: qty 2

## 2023-04-12 MED ORDER — NIFEDIPINE ER 30 MG PO TB24: ORAL_TABLET | ORAL | 11 refills | Status: DC

## 2023-04-12 MED ORDER — SENNOSIDES-DOCUSATE SODIUM 8.6-50 MG PO TABS: 2.0000 | ORAL_TABLET | Freq: Every day | ORAL | Status: DC

## 2023-04-12 NOTE — Progress Notes (Signed)
Subjective: Postpartum Day 2: Cesarean Delivery Patient is doing well this morning. Reports incisional pain, would like to try abdominal binder. Ambulating, voiding, tolerating PO. Minimal lochia. Breastfeeding.   Objective: Patient Vitals for the past 24 hrs:  BP Temp Temp src Pulse Resp SpO2  04/12/23 0536 (!) 144/82 98.8 F (37.1 C) Oral 94 16 99 %  04/11/23 2024 (!) 141/77 99.2 F (37.3 C) Oral 92 18 98 %  04/11/23 1630 (!) 140/80 98.4 F (36.9 C) Oral 82 20 --  04/11/23 1022 (!) 135/100 98 F (36.7 C) Oral 86 18 99 %     Physical Exam:  General: alert, cooperative, and no distress Lochia: appropriate Uterine Fundus: firm Incision: Prevena in place, dry tubing DVT Evaluation: No evidence of DVT seen on physical exam.  Recent Labs    04/10/23 1853 04/11/23 0426  HGB 12.4 10.8*  HCT 37.7 33.3*    Assessment/Plan: Zaylah Ripoll [id number] POD#2 sp RCS at [redacted]w[redacted]d 1. PPC: routine PP care, abdominal binder ordered 2. CHTN: Mild range Bps overnight, increased Procardia to 60mg  for AM dose, continue Procardia 30mg  at bedtime.  3. DVT prophylaxis: lovenox  4. Dispo: desires discharge home today pending BP control. Instructions reviewed.   Charlett Nose 04/12/2023, 9:46 AM

## 2023-04-12 NOTE — Discharge Summary (Signed)
Postpartum Discharge Summary  Date of Service updated 04/12/23      Patient Name: Stacey Robinson DOB: 03-19-1991 MRN: [id number]  Date of admission: 04/10/2023 Delivery date:04/10/2023 Delivering provider: Waynard Reeds Date of discharge: 04/12/2023  Admitting diagnosis: Encounter for maternal care for low transverse scar from repeat cesarean delivery [O34.211] Intrauterine pregnancy: [redacted]w[redacted]d     Secondary diagnosis:  Principal Problem:   Encounter for maternal care for low transverse scar from repeat cesarean delivery  Additional problems: chronic hypertension, morbid obesity    Discharge diagnosis: Term Pregnancy Delivered and CHTN                                              Post partum procedures: none Augmentation: N/A Complications: None  Hospital course: Sceduled C/S   Stacey Robinson at [redacted]w[redacted]d was admitted to the hospital 04/10/2023 for scheduled cesarean section and bilateral salpingectomy with the following indication: Repeat and desire for permanent sterilization.Delivery details are as follows:  Membrane Rupture Time/Date: 3:29 PM,04/10/2023  Delivery Method:C-Section, Low Transverse Details of operation can be found in separate operative note.  Patient had a postpartum course complicated by elevated blood pressures, her hypertensive medications were adjusted to Procardia XL 60mg  in AM and 30mg  in PM.  She is ambulating, tolerating a regular diet, passing flatus, and urinating well. Patient is discharged home in stable condition on  04/12/23        Newborn Data: Birth date:04/10/2023 Birth time:3:30 PM Gender:Female Living status:Living Apgars:8 ,8  Weight:2920 g    Magnesium Sulfate received: No BMZ received: No Rhophylac:N/A MMR:N/A T-DaP: declined Flu: declined RSV Vaccine received: delined Transfusion:No Immunizations administered:  There is no immunization history on file for this patient.  Physical exam  Vitals:   04/11/23 2024 04/12/23 0536  04/12/23 1053 04/12/23 1306  BP: (!) 141/77 (!) 144/82 (!) 158/81 136/73  Pulse: 92 94 96   Resp: 18 16    Temp: 99.2 F (37.3 C) 98.8 F (37.1 C)    TempSrc: Oral Oral    SpO2: 98% 99%    Weight:      Height:       General: alert, cooperative, and no distress Lochia: appropriate Uterine Fundus: firm Incision: Prevena in place, tubing dry DVT Evaluation: No evidence of DVT seen on physical exam. Labs: Lab Results  Component Value Date   WBC 14.1 (H) 04/11/2023   HGB 10.8 (L) 04/11/2023   HCT 33.3 (L) 04/11/2023   MCV 94.1 04/11/2023   PLT 239 04/11/2023      Latest Ref Rng & Units 04/10/2023    6:53 PM  CMP  Creatinine 0.44 - 1.00 mg/dL 8.11    Edinburgh Score:    04/10/2023    9:45 PM  Edinburgh Postnatal Depression Scale Screening Tool  I have been able to laugh and see the funny side of things. 0  I have looked forward with enjoyment to things. 0  I have blamed myself unnecessarily when things went wrong. 0  I have been anxious or worried for no good reason. 1  I have felt scared or panicky for no good reason. 1  Things have been getting on top of me. 0  I have been so unhappy that I have had difficulty sleeping. 0  I have felt sad or miserable. 0  I have been so unhappy that I  have been crying. 0  The thought of harming myself has occurred to me. 0  Edinburgh Postnatal Depression Scale Total 2      After visit meds:  Allergies as of 04/12/2023       Reactions   Penicillins Hives   Has patient had a PCN reaction causing immediate rash, facial/tongue/throat swelling, SOB or lightheadedness with hypotension: No Has patient had a PCN reaction causing severe rash involving mucus membranes or skin necrosis: No Has patient had a PCN reaction that required hospitalization: No Has patient had a PCN reaction occurring within the last 10 years: No If all of the above answers are "NO", then may proceed with Cephalosporin use.   Nsaids Other (See Comments)    Gastric sleeve        Medication List     STOP taking these medications    aspirin EC 81 MG tablet       TAKE these medications    acetaminophen 500 MG tablet Commonly known as: TYLENOL Take 1,000 mg by mouth every 6 (six) hours as needed for mild pain (pain score 1-3) or moderate pain (pain score 4-6).   cetirizine 10 MG chewable tablet Commonly known as: ZYRTEC Chew 10 mg by mouth daily.   NIFEdipine 30 MG 24 hr tablet Commonly known as: ADALAT CC Take 60mg  by mouth in AM and 30mg  by mouth in PM What changed:  how much to take how to take this when to take this additional instructions   oxyCODONE 5 MG immediate release tablet Commonly known as: Oxy IR/ROXICODONE Take 1 tablet (5 mg total) by mouth every 4 (four) hours as needed for moderate pain (pain score 4-6).   Prenatal Vitamins 28-0.8 MG Tabs Take 1 tablet by mouth daily.   senna-docusate 8.6-50 MG tablet Commonly known as: Senokot-S Take 2 tablets by mouth daily.               Discharge Care Instructions  (From admission, onward)           Start     Ordered   04/12/23 0000  Discharge wound care:       Comments: Remove Prevena 7 days after c-section   04/12/23 1348             Discharge home in stable condition Infant Feeding: Breast Infant Disposition:home with mother Discharge instruction: per After Visit Summary and Postpartum booklet. Activity: Advance as tolerated. Pelvic rest for 6 weeks.  Diet: routine diet Anticipated Birth Control:  bilateral salpingectomy done at time of cesarean Postpartum Appointment:4 weeks Additional Postpartum F/U: Incision check 1 week and BP check 1 week Future Appointments:No future appointments. Follow up Visit:  Follow-up Information     Ob/Gyn, Nestor Ramp. Schedule an appointment as soon as possible for a visit in 1 week(s).   Why: Blood pressure and incision check Contact information: [address] [place]  [address] [phone]                     04/12/2023 Charlett Nose, MD

## 2023-04-12 NOTE — Lactation Note (Signed)
This note was copied from a baby's chart. Lactation Consultation Note  Patient Name: Stacey Robinson Today's Date: 04/12/2023 Age:34 hours Reason for consult: Follow-up assessment;Exclusive pumping and bottle feeding;Early term 37-38.6wks;Infant weight loss;Maternal endocrine disorder  P4- MOB states that infant is still feeding well. MOB plans to start pumping once home and bottle feed as she did with her other children. MOB denies having any questions or concerns at this time.  LC reviewed CDC milk storage guidelines, LC services handout and engorgement/breast care. LC encouraged MOB to call lactation team for further assistance as needed.  Maternal Data Has patient been taught Hand Expression?: Yes  Feeding Mother's Current Feeding Choice: Breast Milk and Formula  Lactation Tools Discussed/Used Pump Education: Milk Storage  Interventions Interventions: Breast feeding basics reviewed;Education;LC Services brochure  Discharge Discharge Education: Engorgement and breast care;Warning signs for feeding baby Pump: Hands Free;Personal  Consult Status Consult Status: Complete Date: 04/13/23 Follow-up type: In-patient    Dema Severin BS, IBCLC 04/12/2023, 4:17 PM

## 2023-04-27 ENCOUNTER — Telehealth (HOSPITAL_COMMUNITY): Payer: Self-pay

## 2023-04-27 NOTE — Telephone Encounter (Signed)
04/27/2023 1131  Name: Audrey Dilana Mukherjee MRN: [id number] DOB: 01/16/1991  Reason for Call:  Transition of Care Hospital Discharge Call  Contact Status: Patient Contact Status: Message  Language assistant needed: Interpreter Mode: Interpreter Not Needed        Follow-Up Questions:    Edinburgh Postnatal Depression Scale:  In the Past 7 Days:    PHQ2-9 Depression Scale:     Discharge Follow-up:    Post-discharge interventions: NA  Signature  Signe Colt

## 2024-07-14 ENCOUNTER — Ambulatory Visit

## 2024-07-14 VITALS — BP 138/83 | HR 69 | Temp 98.6°F | Ht 65.0 in | Wt 231.1 lb

## 2024-07-14 DIAGNOSIS — Z1239 Encounter for other screening for malignant neoplasm of breast: Secondary | ICD-10-CM

## 2024-07-14 DIAGNOSIS — M25461 Effusion, right knee: Secondary | ICD-10-CM | POA: Insufficient documentation

## 2024-07-14 DIAGNOSIS — Z6838 Body mass index (BMI) 38.0-38.9, adult: Secondary | ICD-10-CM

## 2024-07-14 DIAGNOSIS — Z130 Encounter for screening for diseases of the blood and blood-forming organs and certain disorders involving the immune mechanism: Secondary | ICD-10-CM

## 2024-07-14 DIAGNOSIS — Z0000 Encounter for general adult medical examination without abnormal findings: Secondary | ICD-10-CM | POA: Insufficient documentation

## 2024-07-14 DIAGNOSIS — Z9189 Other specified personal risk factors, not elsewhere classified: Secondary | ICD-10-CM | POA: Diagnosis not present

## 2024-07-14 DIAGNOSIS — E049 Nontoxic goiter, unspecified: Secondary | ICD-10-CM

## 2024-07-14 DIAGNOSIS — I10 Essential (primary) hypertension: Secondary | ICD-10-CM | POA: Diagnosis not present

## 2024-07-14 DIAGNOSIS — E66812 Obesity, class 2: Secondary | ICD-10-CM

## 2024-07-14 DIAGNOSIS — E010 Iodine-deficiency related diffuse (endemic) goiter: Secondary | ICD-10-CM

## 2024-07-14 DIAGNOSIS — Z803 Family history of malignant neoplasm of breast: Secondary | ICD-10-CM

## 2024-07-14 DIAGNOSIS — Z6841 Body Mass Index (BMI) 40.0 and over, adult: Secondary | ICD-10-CM

## 2024-07-14 NOTE — Assessment & Plan Note (Signed)
 Routine wellness visit to establish care. Pap smear likely up to date due to recent pregnancy. Mammogram screening age may be adjusted due to family history of breast cancer. Tetanus vaccination is due. - Ordered baseline blood work including kidney function, liver function, thyroid panel, A1c, cholesterol, and vitamin D . - Will obtain records to confirm Pap smear status. - Will verify mammogram screening guidelines due to family history of breast cancer. - Will administer tetanus vaccination if due.

## 2024-07-14 NOTE — Progress Notes (Signed)
 "  New Patient Office Visit  Subjective    Patient ID: Stacey Robinson, female    DOB: 1990-07-21  Age: 34 y.o. MRN: [id number]  CC:  Chief Complaint  Patient presents with   New Patient (Initial Visit)    Establish Care    Discussed the use of AI scribe software for clinical note transcription with the patient, who gave verbal consent to proceed.  History of Present Illness   Stacey Robinson is a 34 year old female who presents with weight loss and fat distribution concerns.  Weight loss and fat distribution - Significant weight loss totaling approximately 220 pounds over several years. - Initial weight loss of 60 pounds prior to gastric sleeve surgery. - Gained 70 pounds during pregnancy. - Lost 150 pounds since starting GLP-1 receptor agonist therapy (compounded tirzepatide 5 mg weekly) in May 2025. - Expresses concern regarding abnormal fat distribution, described as 'oddly' distributed at highest weight and persisting despite weight loss but has improved over time  - No pain or swelling in the legs. - Inquires about possibility of lymphedema. - Notes that the swelling is directly over her right knee. Denies knee pain or associated injury. No calf pain, numbness or tingling. Does note crepitus in both knees constantly.   Hypertension - Diagnosed with high blood pressure in 2021. - No antihypertensive medication required since weight loss.  Thyroid enlargement - History of enlarged thyroid diagnosed by previous PCP in 2020  - No symptoms of thyroid dysfunction. - No specific thyroid function testing performed, including ultrasound   Relevant surgical history - History of left ankle fracture requiring surgery. - History of gastric sleeve surgery. - History of polycystectomy. - History of multiple C-sections.       Screenings:   Breast Cancer: Her biological sister was diagnosed with breast cancer at age 72. BRCA1/2 negative per patient. Either way,  this makes her a high risk breast cancer screening patient. Will order mammogram today  Diabetes: Checking A1c with labs  HLD: Checking lipid panel with labs  The ASCVD Risk score (Arnett DK, et al., 2019) failed to calculate for the following reasons:   The 2019 ASCVD risk score is only valid for ages 8 to 57   * - Cholesterol units were assumed   Outpatient Encounter Medications as of 07/14/2024  Medication Sig   tirzepatide 5 MG/0.5ML injection vial Inject into the skin.   [DISCONTINUED] acetaminophen  (TYLENOL ) 500 MG tablet Take 1,000 mg by mouth every 6 (six) hours as needed for mild pain (pain score 1-3) or moderate pain (pain score 4-6).   [DISCONTINUED] cetirizine (ZYRTEC) 10 MG chewable tablet Chew 10 mg by mouth daily.   [DISCONTINUED] NIFEdipine  (ADALAT  CC) 30 MG 24 hr tablet Take 60mg  by mouth in AM and 30mg  by mouth in PM   [DISCONTINUED] oxyCODONE  (OXY IR/ROXICODONE ) 5 MG immediate release tablet Take 1-2 tablets (5-10 mg total) by mouth every 4 (four) hours as needed for moderate pain (pain score 4-6) or severe pain (pain score 7-10).   [DISCONTINUED] Prenatal Vit-Fe Fumarate-FA (PRENATAL VITAMINS) 28-0.8 MG TABS Take 1 tablet by mouth daily.   [DISCONTINUED] senna-docusate (SENOKOT-S) 8.6-50 MG tablet Take 2 tablets by mouth daily.   No facility-administered encounter medications on file as of 07/14/2024.    Past Medical History:  Diagnosis Date   Closed displaced trimalleolar fracture of left lower leg 01/17/2018   Enlarged thyroid    Hypertension    Pain in right ankle and joints of right  foot 03/06/2018   Trimalleolar fracture of ankle, closed    Left   Vitamin D  deficiency     Past Surgical History:  Procedure Laterality Date   CESAREAN SECTION     x 3   CESAREAN SECTION WITH BILATERAL TUBAL LIGATION N/A 04/10/2023   Procedure: CESAREAN SECTION WITH BILATERAL TUBAL LIGATION;  Surgeon: Okey Leader, MD;  Location: [facility];  Service: Obstetrics;  Laterality: N/A;    CHOLECYSTECTOMY     2011   GASTRECTOMY     ORIF ANKLE FRACTURE Left 12/02/2017   Procedure: OPEN REDUCTION INTERNAL FIXATION (ORIF) LEFT TRIMALLEOLAR ANKLE FRACTURE;  Surgeon: Jerri Kay HERO, MD;  Location: [facility];  Service: Orthopedics;  Laterality: Left;   TUBAL LIGATION Bilateral 04/10/2023   Procedure: BILATERAL TUBAL LIGATION;  Surgeon: Okey Leader, MD;  Location: [facility];  Service: Obstetrics;  Laterality: Bilateral;    Family History  Problem Relation Age of Onset   Diabetes Mother    Hypertension Mother    Diabetes Father    Hypertension Father    Breast cancer Paternal Aunt    Diabetes Paternal Aunt    Diabetes Paternal Uncle     Social History   Socioeconomic History   Marital status: Married    Spouse name: Not on file   Number of children: 3   Years of education: associates   Highest education level: Associate degree: occupational, scientist, product/process development, or vocational program  Occupational History   Not on file  Tobacco Use   Smoking status: Never   Smokeless tobacco: Never  Vaping Use   Vaping status: Never Used  Substance and Sexual Activity   Alcohol use: Never   Drug use: Never   Sexual activity: Yes    Birth control/protection: Implant    Comment: Expired implant in arm   Other Topics Concern   Not on file  Social History Narrative   Not on file   Social Drivers of Health   Tobacco Use: Low Risk (07/14/2024)   Patient History    Smoking Tobacco Use: Never    Smokeless Tobacco Use: Never    Passive Exposure: Not on file  Financial Resource Strain: Low Risk (07/14/2024)   Overall Financial Resource Strain (CARDIA)    Difficulty of Paying Living Expenses: Not hard at all  Food Insecurity: No Food Insecurity (07/14/2024)   Epic    Worried About Radiation Protection Practitioner of Food in the Last Year: Never true    Ran Out of Food in the Last Year: Never true  Transportation Needs: No Transportation Needs (07/14/2024)   Epic    Lack of Transportation (Medical): No    Lack of  Transportation (Non-Medical): No  Physical Activity: Sufficiently Active (07/14/2024)   Exercise Vital Sign    Days of Exercise per Week: 7 days    Minutes of Exercise per Session: 90 min  Stress: No Stress Concern Present (07/14/2024)   [employer] of Occupational Health - Occupational Stress Questionnaire    Feeling of Stress: Not at all  Social Connections: Moderately Integrated (07/14/2024)   Social Connection and Isolation Panel    Frequency of Communication with Friends and Family: More than three times a week    Frequency of Social Gatherings with Friends and Family: Once a week    Attends Religious Services: Never    Database Administrator or Organizations: Yes    Attends Engineer, Structural: More than 4 times per year    Marital Status: Married  Intimate  Partner Violence: Not on file  Depression (PHQ2-9): Low Risk (07/14/2024)   Depression (PHQ2-9)    PHQ-2 Score: 0  Alcohol Screen: Not on file  Housing: Low Risk (07/14/2024)   Epic    Unable to Pay for Housing in the Last Year: No    Number of Times Moved in the Last Year: 0    Homeless in the Last Year: No  Utilities: Not on file  Health Literacy: Not on file    ROS  Per HPI      Objective    BP 138/83   Pulse 69   Temp 98.6 F (37 C) (Oral)   Ht 5' 5 (1.651 m)   Wt 231 lb 1.9 oz (104.8 kg)   SpO2 100%   BMI 38.46 kg/m   Physical Exam Constitutional:      General: She is not in acute distress.    Appearance: Normal appearance.  Neck:     Comments: Thyroid mildly but diffusely enlarged with no thryoid nodules palpated  Cardiovascular:     Rate and Rhythm: Normal rate and regular rhythm.     Heart sounds: Normal heart sounds. No murmur heard.    No friction rub. No gallop.  Pulmonary:     Effort: Pulmonary effort is normal. No respiratory distress.     Breath sounds: Normal breath sounds.  Musculoskeletal:        General: No swelling.     Cervical back: Neck supple.  Lymphadenopathy:      Cervical: No cervical adenopathy.  Skin:    General: Skin is warm and dry.  Neurological:     General: No focal deficit present.     Mental Status: She is alert.  Psychiatric:        Mood and Affect: Mood normal.        Behavior: Behavior normal.        Thought Content: Thought content normal.          Assessment & Plan:   Swelling of right knee Assessment & Plan: Pocket of swelling over right knee with knee crepitus. No pain. Suspect synovial fluid versus abnormal fat distribution due to previous severe obesity.  Order XR right knee to [facility] to look for arthritis. Discussed she is at higher risk for arthritis due to hx of obesity. Will follow up with results. If swelling persists or becomes painful, consider referral to ortho for fluid removal.   Orders: -     DG Knee Complete 4 Views Right; Future  Screening for endocrine, nutritional, metabolic and immunity disorder -     VITAMIN D  25 Hydroxy (Vit-D Deficiency, Fractures); Future -     Hemoglobin A1c; Future -     Lipid panel; Future -     Comprehensive metabolic panel with GFR; Future -     CBC with Differential/Platelet; Future -     Iron, TIBC and Ferritin Panel; Future -     Thyroid Panel With TSH; Future  Thyromegaly -     Thyroid Panel With TSH; Future  Essential hypertension Assessment & Plan: Bp Goal < 140/90. Bp at goal today without medication. Continue with weight loss efforts to control BP.   Orders: -     Comprehensive metabolic panel with GFR; Future -     CBC with Differential/Platelet; Future  Body mass index 50.0-59.9, adult (HCC) -     VITAMIN D  25 Hydroxy (Vit-D Deficiency, Fractures); Future -     Hemoglobin A1c; Future -  Lipid panel; Future -     Comprehensive metabolic panel with GFR; Future -     CBC with Differential/Platelet; Future -     Iron, TIBC and Ferritin Panel; Future -     Thyroid Panel With TSH; Future  Family history of breast cancer in sister -     3D  Screening Mammogram, Left and Right; Future  Breast cancer screening, high risk patient -     3D Screening Mammogram, Left and Right; Future  Thyroid enlargement Assessment & Plan: Mild diffuse palpable thryoid on exam. This was originally noted by previous PCP. Tsh was monitored periodically. Will order thryoid panel today. If abnormal, will get thyroid ultrasound. Patient agreeable to this and does not want to pursue additional workup until thyroid panel is back.    Class 2 obesity without serious comorbidity with body mass index (BMI) of 38.0 to 38.9 in adult, unspecified obesity type Assessment & Plan: BMI 38. Significant weight loss of 220 pounds, including 150 pounds since starting GLP-1 therapy. No current medications for obesity management other than weekly compounded tirzepatide.   Healthcare maintenance Assessment & Plan: Routine wellness visit to establish care. Pap smear likely up to date due to recent pregnancy. Mammogram screening age may be adjusted due to family history of breast cancer. Tetanus vaccination is due. - Ordered baseline blood work including kidney function, liver function, thyroid panel, A1c, cholesterol, and vitamin D . - Will obtain records to confirm Pap smear status. - Will verify mammogram screening guidelines due to family history of breast cancer. - Will administer tetanus vaccination if due.   At high risk for breast cancer Assessment & Plan: At high risk due to primary breast cancer diagnosis in biological sister at age 54. This qualifies her for early mammograms per current guidelines. Screening mammo palced to [facility] today      Return in about 1 year (around 07/14/2025) for Physical.   Saddie JULIANNA Sacks, PA-C  "

## 2024-07-14 NOTE — Patient Instructions (Signed)
 VISIT SUMMARY: Today, you had a routine wellness visit to establish care. We discussed your significant weight loss, concerns about fat distribution, and your history of hypertension and thyroid enlargement. We also reviewed your surgical history and planned some follow-up tests and screenings.  YOUR PLAN: WEIGHT LOSS AND FAT DISTRIBUTION: You have lost a significant amount of weight over the years, including 150 pounds since starting GLP-1 therapy. You are concerned about abnormal fat distribution. -Continue with your current GLP-1 receptor agonist therapy (compounded tirzepatide 5 mg weekly). -We will monitor your progress and address any concerns about fat distribution in future visits.  HYPERTENSION: You were diagnosed with high blood pressure in 2021 but have not needed medication since your weight loss. -Continue monitoring your blood pressure regularly.  ENLARGED THYROID: You have a history of an enlarged thyroid but no symptoms of thyroid dysfunction. -We palpated your thyroid to check for enlargement. -We ordered thyroid function tests to assess your thyroid health. -If the thyroid function tests are abnormal, we will consider a thyroid ultrasound.  WOMAN'S WELLNESS VISIT: This was a routine wellness visit to establish care. We discussed your Pap smear, mammogram screening, and tetanus vaccination. -We ordered baseline blood work including kidney function, liver function, thyroid panel, A1c, cholesterol, and vitamin D . -We will obtain your records to confirm the status of your Pap smear. -We will verify mammogram screening guidelines due to your family history of breast cancer. -We will administer a tetanus vaccination if it is due.  If you have any problems before your next visit feel free to message me via MyChart (minor issues or questions) or call the office, otherwise you may reach out to schedule an office visit.  Thank you! Saddie Sacks, PA-C

## 2024-07-14 NOTE — Assessment & Plan Note (Signed)
 Bp Goal < 140/90. Bp at goal today without medication. Continue with weight loss efforts to control BP.

## 2024-07-22 ENCOUNTER — Other Ambulatory Visit

## 2025-07-15 ENCOUNTER — Encounter
